# Patient Record
Sex: Female | Born: 1975 | Race: Black or African American | Hispanic: No | Smoking: Never smoker
Health system: Southern US, Community
[De-identification: ages and names within clinical notes are randomized; demographics above are authoritative.]

## PROBLEM LIST (undated history)

## (undated) DIAGNOSIS — IMO0002 Reserved for concepts with insufficient information to code with codable children: Secondary | ICD-10-CM

## (undated) DIAGNOSIS — T7840XA Allergy, unspecified, initial encounter: Secondary | ICD-10-CM

## (undated) DIAGNOSIS — N879 Dysplasia of cervix uteri, unspecified: Secondary | ICD-10-CM

## (undated) DIAGNOSIS — R87610 Atypical squamous cells of undetermined significance on cytologic smear of cervix (ASC-US): Secondary | ICD-10-CM

## (undated) DIAGNOSIS — E282 Polycystic ovarian syndrome: Secondary | ICD-10-CM

## (undated) DIAGNOSIS — N970 Female infertility associated with anovulation: Secondary | ICD-10-CM

## (undated) DIAGNOSIS — G43109 Migraine with aura, not intractable, without status migrainosus: Secondary | ICD-10-CM

## (undated) DIAGNOSIS — R87619 Unspecified abnormal cytological findings in specimens from cervix uteri: Secondary | ICD-10-CM

## (undated) HISTORY — PX: BREAST BIOPSY: SHX20

## (undated) HISTORY — PX: BUNIONECTOMY: SHX129

## (undated) HISTORY — DX: Reserved for concepts with insufficient information to code with codable children: IMO0002

## (undated) HISTORY — DX: Polycystic ovarian syndrome: E28.2

## (undated) HISTORY — DX: Dysplasia of cervix uteri, unspecified: N87.9

## (undated) HISTORY — PX: CERVICAL BIOPSY  W/ LOOP ELECTRODE EXCISION: SUR135

## (undated) HISTORY — DX: Allergy, unspecified, initial encounter: T78.40XA

## (undated) HISTORY — DX: Unspecified abnormal cytological findings in specimens from cervix uteri: R87.619

## (undated) HISTORY — DX: Migraine with aura, not intractable, without status migrainosus: G43.109

## (undated) HISTORY — DX: Atypical squamous cells of undetermined significance on cytologic smear of cervix (ASC-US): R87.610

## (undated) HISTORY — PX: EYE SURGERY: SHX253

## (undated) HISTORY — DX: Female infertility associated with anovulation: N97.0

## (undated) HISTORY — PX: WISDOM TOOTH EXTRACTION: SHX21

## (undated) HISTORY — PX: KNEE ARTHROSCOPY: SUR90

## (undated) HISTORY — PX: LASIK: SHX215

---
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
## 1998-04-08 ENCOUNTER — Other Ambulatory Visit: Admission: RE | Admit: 1998-04-08 | Discharge: 1998-04-08 | Payer: Self-pay | Admitting: Obstetrics and Gynecology

## 1998-12-13 ENCOUNTER — Other Ambulatory Visit: Admission: RE | Admit: 1998-12-13 | Discharge: 1998-12-13 | Payer: Self-pay | Admitting: Obstetrics and Gynecology

## 2000-03-28 ENCOUNTER — Other Ambulatory Visit: Admission: RE | Admit: 2000-03-28 | Discharge: 2000-03-28 | Payer: Self-pay | Admitting: Obstetrics and Gynecology

## 2000-05-07 ENCOUNTER — Encounter: Admission: RE | Admit: 2000-05-07 | Discharge: 2000-05-07 | Payer: Self-pay | Admitting: Obstetrics and Gynecology

## 2000-05-07 ENCOUNTER — Encounter: Payer: Self-pay | Admitting: Obstetrics and Gynecology

## 2001-01-20 ENCOUNTER — Emergency Department (HOSPITAL_COMMUNITY): Admission: EM | Admit: 2001-01-20 | Discharge: 2001-01-20 | Payer: Self-pay | Admitting: Emergency Medicine

## 2001-04-24 ENCOUNTER — Other Ambulatory Visit: Admission: RE | Admit: 2001-04-24 | Discharge: 2001-04-24 | Payer: Self-pay | Admitting: Obstetrics and Gynecology

## 2001-11-27 ENCOUNTER — Emergency Department (HOSPITAL_COMMUNITY): Admission: EM | Admit: 2001-11-27 | Discharge: 2001-11-27 | Payer: Self-pay | Admitting: Emergency Medicine

## 2002-02-03 ENCOUNTER — Other Ambulatory Visit: Admission: RE | Admit: 2002-02-03 | Discharge: 2002-02-03 | Payer: Self-pay | Admitting: Obstetrics and Gynecology

## 2002-06-10 ENCOUNTER — Encounter: Payer: Self-pay | Admitting: Emergency Medicine

## 2002-06-10 ENCOUNTER — Encounter: Admission: RE | Admit: 2002-06-10 | Discharge: 2002-06-10 | Payer: Self-pay | Admitting: Emergency Medicine

## 2002-11-28 ENCOUNTER — Encounter: Admission: RE | Admit: 2002-11-28 | Discharge: 2002-11-28 | Payer: Self-pay | Admitting: Emergency Medicine

## 2002-11-28 ENCOUNTER — Encounter: Payer: Self-pay | Admitting: Emergency Medicine

## 2003-01-14 ENCOUNTER — Encounter: Payer: Self-pay | Admitting: Emergency Medicine

## 2003-01-14 ENCOUNTER — Encounter: Admission: RE | Admit: 2003-01-14 | Discharge: 2003-01-14 | Payer: Self-pay | Admitting: Emergency Medicine

## 2003-02-07 ENCOUNTER — Encounter: Payer: Self-pay | Admitting: Emergency Medicine

## 2003-02-07 ENCOUNTER — Encounter: Admission: RE | Admit: 2003-02-07 | Discharge: 2003-02-07 | Payer: Self-pay | Admitting: Emergency Medicine

## 2003-02-11 ENCOUNTER — Other Ambulatory Visit: Admission: RE | Admit: 2003-02-11 | Discharge: 2003-02-11 | Payer: Self-pay | Admitting: Obstetrics and Gynecology

## 2004-02-29 ENCOUNTER — Other Ambulatory Visit: Admission: RE | Admit: 2004-02-29 | Discharge: 2004-02-29 | Payer: Self-pay | Admitting: Obstetrics and Gynecology

## 2004-03-07 ENCOUNTER — Ambulatory Visit (HOSPITAL_COMMUNITY): Admission: RE | Admit: 2004-03-07 | Discharge: 2004-03-07 | Payer: Self-pay | Admitting: Obstetrics and Gynecology

## 2004-03-17 ENCOUNTER — Ambulatory Visit (HOSPITAL_COMMUNITY): Admission: RE | Admit: 2004-03-17 | Discharge: 2004-03-17 | Payer: Self-pay | Admitting: Obstetrics and Gynecology

## 2004-12-04 HISTORY — PX: LEEP: SHX91

## 2005-03-29 ENCOUNTER — Other Ambulatory Visit: Admission: RE | Admit: 2005-03-29 | Discharge: 2005-03-29 | Payer: Self-pay | Admitting: Obstetrics and Gynecology

## 2005-06-09 ENCOUNTER — Ambulatory Visit (HOSPITAL_COMMUNITY): Admission: RE | Admit: 2005-06-09 | Discharge: 2005-06-09 | Payer: Self-pay | Admitting: Obstetrics and Gynecology

## 2005-06-09 ENCOUNTER — Encounter (INDEPENDENT_AMBULATORY_CARE_PROVIDER_SITE_OTHER): Payer: Self-pay | Admitting: Specialist

## 2005-09-05 ENCOUNTER — Other Ambulatory Visit: Admission: RE | Admit: 2005-09-05 | Discharge: 2005-09-05 | Payer: Self-pay | Admitting: Obstetrics and Gynecology

## 2006-04-02 ENCOUNTER — Other Ambulatory Visit: Admission: RE | Admit: 2006-04-02 | Discharge: 2006-04-02 | Payer: Self-pay | Admitting: Obstetrics and Gynecology

## 2008-06-08 ENCOUNTER — Encounter: Admission: RE | Admit: 2008-06-08 | Discharge: 2008-06-08 | Payer: Self-pay | Admitting: Emergency Medicine

## 2011-04-21 NOTE — Op Note (Signed)
NAMEVERMA, GROTHAUS                ACCOUNT NO.:  [id number]   MEDICAL RECORD NO.:  [id number]          PATIENT TYPE:  AMB   LOCATION:  SDC                           FACILITY:  [facility]   PHYSICIAN:  Janine Limbo, M.D.DATE OF BIRTH:  08-03-1976   DATE OF PROCEDURE:  06/09/2005  DATE OF DISCHARGE:                                 OPERATIVE REPORT   PREOPERATIVE DIAGNOSES:  1.  Recurrent atypical squamous cells of undetermined significance on Pap      smear.  2.  Inadequate colposcopy.  3.  Prior cryotherapy of the cervix.   POSTOPERATIVE DIAGNOSES:  1.  Recurrent atypical squamous cells of undetermined significance on Pap      smear.  2.  Inadequate colposcopy.  3.  Prior cryotherapy of the cervix.   PROCEDURE:  Loop electrosurgical excision procedure.   SURGEON:  Dr. Leonard Schwartz.   ANESTHETIC:  Local.   DISPOSITION:  Ms. Gaugh is a 35 year old female, who presents with a  history of recurrent abnormal Pap smears.  Colposcopically directed biopsies  were performed, but we could not fully evaluate the transition zone.  The  patient has had a prior cryotherapy procedure of the cervix.  The patient  understands the indications for her procedure and she accepts the risk of,  but not limited to, anesthetic complications, bleeding, infections, and  possible damage to the surrounding organs.   FINDINGS:  No lesions were noted on the exocervix.   PROCEDURE:  The patient was taken to the operating room where she was placed  in the lithotomy position.  The vagina and cervix were prepped with multiple  layers of Betadine.  The cervix was injected with a diluted solution of  Marcaine, Pitressin, and normal saline.  We used the LEEP machine to obtain  a conization specimen that removed the endocervix.  Hemostasis was noted to  be adequate.  The estimated blood loss for the procedure was 1 mL.  The  patient tolerated her procedure well.  She was returned to the supine  position and then taken to the recovery room in stable condition.  The  conization specimen was sent to pathology for evaluation.   FOLLOW-UP INSTRUCTIONS:  The patient was given a prescription for Vicodin,  and she will take 1-2 tablets every 4 hours as needed for pain.  She will  return to see Dr. Stefano Gaul in 2-3 weeks for follow-up examination.  She  was given a copy of the postoperative instruction sheet as prepared by [facility] for patients who have undergone a dilatation  and curettage, but then that instruction sheet was modified for a LEEP  procedure.       AVS/MEDQ  D:  06/09/2005  T:  06/09/2005  Job:  [id number]

## 2011-04-21 NOTE — H&P (Signed)
NAMEAMARII, Miranda Knight                ACCOUNT NO.:  [id number]   MEDICAL RECORD NO.:  [id number]          PATIENT TYPE:  AMB   LOCATION:  SDC                           FACILITY:  [facility]   PHYSICIAN:  Janine Limbo, M.D.DATE OF BIRTH:  1976-03-04   DATE OF ADMISSION:  DATE OF DISCHARGE:                                HISTORY & PHYSICAL   HISTORY OF PRESENT ILLNESS:  Ms. Barillas is a 35 year old female, para 0-0-1-  0, who presents for a loop electrosurgical excision and procedure because of  recurrent abnormal Pap smears with a known history of the human papilloma  virus.  Colposcopy and biopsies were performed on Apr 26, 2005, but the  transition zone could not be fully seen.  The patient had cryotherapy in  1998.   PAST MEDICAL HISTORY:  The patient had her wisdom teeth removed in 1995.   ALLERGIES:  No known drug allergies.   OBSTETRICAL HISTORY:  The patient has had one elective pregnancy termination  in the first trimester.   SOCIAL HISTORY:  The patient denies cigarette use, alcohol use, and  recreational drug use.   REVIEW OF SYSTEMS:  Noncontributory.   FAMILY HISTORY:  Noncontributory.   PHYSICAL EXAMINATION:  VITAL SIGNS:  Weight is 148 pounds, height is 5 feet  3 inches.  HEENT:  Within normal limits.  CHEST:  Clear.  HEART:  Regular rate and rhythm.  BREASTS:  Her breasts are without masses.  ABDOMEN:  Nontender.  EXTREMITIES:  Within normal limits.  NEUROLOGIC:  Exam is grossly normal.  PELVIC:  External genitalia are normal.  The vagina is normal.  The cervix  is nontender.  No lesions were seen on the exocervix with the colposcope.  The uterus is normal size, shape, and consistency.  Adnexa:  No masses.  Rectovaginal exam confirms.   ASSESSMENT:  Recurrent abnormal Pap smears with a transition zone that  cannot be completely seen.  The patient had cryotherapy in the past.   PLAN:  The patient will undergo a loop electrosurgical excision procedure.  She  understands the indications for her procedure and she accepts the risks  of, but not limited to, anesthetic complications, bleeding, infections, and  possible damage to the surrounding organs.       AVS/MEDQ  D:  06/08/2005  T:  06/08/2005  Job:  [id number]

## 2012-01-21 ENCOUNTER — Ambulatory Visit (INDEPENDENT_AMBULATORY_CARE_PROVIDER_SITE_OTHER): Payer: BC Managed Care – PPO | Admitting: Family Medicine

## 2012-01-21 VITALS — BP 124/79 | HR 85 | Temp 98.0°F | Resp 16 | Ht 64.5 in | Wt 173.0 lb

## 2012-01-21 DIAGNOSIS — H6990 Unspecified Eustachian tube disorder, unspecified ear: Secondary | ICD-10-CM

## 2012-01-21 DIAGNOSIS — J069 Acute upper respiratory infection, unspecified: Secondary | ICD-10-CM

## 2012-01-21 DIAGNOSIS — H6980 Other specified disorders of Eustachian tube, unspecified ear: Secondary | ICD-10-CM

## 2012-01-21 MED ORDER — FLUTICASONE PROPIONATE 50 MCG/ACT NA SUSP: 2.0000 | Freq: Every day | NASAL | Status: DC

## 2012-01-21 MED ORDER — PREDNISONE 20 MG PO TABS: ORAL_TABLET | ORAL | Status: AC

## 2012-01-21 NOTE — Progress Notes (Signed)
  Subjective:    Patient ID: Miranda Knight, female    DOB: [birth year], 36 y.o.   MRN: [id number]  HPI 36 yo female with URI complaints.  Sinus fullness drainage since yesterday.  PND.  No cough.  Ear fullness on right.  Scrathcy throat.  NO fever.  Occ HA.  Last sinusitis March 2012.  Trying to get pregnant, could be now (LMP 12/31/11), and so hasn't tried any otcs.   Review of Systems Negative except as per HPI     Objective:   Physical Exam  Constitutional: She appears well-developed. No distress.  HENT:  Right Ear: External ear and ear canal normal. Tympanic membrane is not injected, not scarred, not perforated, not erythematous, not retracted and not bulging. A middle ear effusion is present.  Left Ear: Tympanic membrane, external ear and ear canal normal. Tympanic membrane is not injected, not scarred, not perforated, not erythematous, not retracted and not bulging.  Nose: Mucosal edema present. No rhinorrhea. Right sinus exhibits no maxillary sinus tenderness and no frontal sinus tenderness. Left sinus exhibits no maxillary sinus tenderness and no frontal sinus tenderness.  Mouth/Throat: Uvula is midline, oropharynx is clear and moist and mucous membranes are normal. No oropharyngeal exudate or tonsillar abscesses.  Cardiovascular: Normal rate, regular rhythm, normal heart sounds and intact distal pulses.   No murmur heard. Pulmonary/Chest: Effort normal and breath sounds normal. No respiratory distress. She has no wheezes. She has no rales.  Lymphadenopathy:       Head (right side): No submandibular and no preauricular adenopathy present.       Head (left side): No submandibular and no preauricular adenopathy present.       Right cervical: No superficial cervical and no posterior cervical adenopathy present.      Left cervical: No superficial cervical and no posterior cervical adenopathy present.       Right: No supraclavicular adenopathy present.       Left: No supraclavicular  adenopathy present.  Skin: Skin is warm and dry.          Assessment & Plan:  ETD/URI  Flonase, predtaper  If does not resolve or worsens, can try amoxicillin.

## 2012-01-24 ENCOUNTER — Telehealth: Payer: Self-pay

## 2012-01-24 NOTE — Telephone Encounter (Signed)
Patient saw dr Georgiana Shore on Sunday she said her ears are still hurting and she feels like her sinus issues are getting worse would like for a nurse to call her back or dr Georgiana Shore when possible

## 2012-01-24 NOTE — Telephone Encounter (Signed)
Dr Georgiana Shore told her to cb by Wednesday if Miranda Knight wasn't getting any better. Miranda Knight says her sinuses are still draining. Right ear pressure. What can we do?

## 2012-01-25 MED ORDER — IPRATROPIUM BROMIDE 0.03 % NA SOLN: 2.0000 | Freq: Two times a day (BID) | NASAL | Status: DC

## 2012-01-25 MED ORDER — AMOXICILLIN 875 MG PO TABS: 1750.0000 mg | ORAL_TABLET | Freq: Two times a day (BID) | ORAL | Status: AC

## 2012-01-25 NOTE — Telephone Encounter (Signed)
I've authorized abx and atrovent nasal spray to help improve drainage and reduce ear pressure.  Please advise patient.

## 2012-01-25 NOTE — Telephone Encounter (Signed)
Patient notified

## 2012-09-15 ENCOUNTER — Ambulatory Visit: Payer: BC Managed Care – PPO

## 2012-09-15 ENCOUNTER — Ambulatory Visit (INDEPENDENT_AMBULATORY_CARE_PROVIDER_SITE_OTHER): Payer: BC Managed Care – PPO | Admitting: Family Medicine

## 2012-09-15 VITALS — BP 110/70 | HR 73 | Temp 98.4°F | Resp 16 | Ht 64.5 in | Wt 173.4 lb

## 2012-09-15 DIAGNOSIS — M7630 Iliotibial band syndrome, unspecified leg: Secondary | ICD-10-CM

## 2012-09-15 DIAGNOSIS — M722 Plantar fascial fibromatosis: Secondary | ICD-10-CM

## 2012-09-15 DIAGNOSIS — M25559 Pain in unspecified hip: Secondary | ICD-10-CM

## 2012-09-15 DIAGNOSIS — M629 Disorder of muscle, unspecified: Secondary | ICD-10-CM

## 2012-09-15 DIAGNOSIS — M25552 Pain in left hip: Secondary | ICD-10-CM

## 2012-09-15 DIAGNOSIS — M79671 Pain in right foot: Secondary | ICD-10-CM

## 2012-09-15 DIAGNOSIS — E282 Polycystic ovarian syndrome: Secondary | ICD-10-CM

## 2012-09-15 DIAGNOSIS — M79609 Pain in unspecified limb: Secondary | ICD-10-CM

## 2012-09-15 LAB — POCT URINE PREGNANCY: Preg Test, Ur: NEGATIVE

## 2012-09-15 NOTE — Patient Instructions (Addendum)
1. Pain of right heel  DG Foot 2 Views Right  2. Pain, joint, hip, left  DG Hip Complete Left  3. PCOS (polycystic ovarian syndrome)  POCT urine pregnancy  4. Plantar fasciitis of right foot    5. Iliotibial band syndrome     Iliotibial Band Syndrome with Rehab The iliotibial (IT) band is a tendon that connects the hip muscles to the shinbone (tibia) and to one of the bones of the pelvis (ileum). The IT band passes by the knee and is often irritated by the outer portion of the knee (lateral femoral condyle). A fluid filled sac (bursa) exists between the tendon and the bone, to cushion and reduce friction. Overuse of the tendon may cause excessive friction, which results in IT band syndrome. This condition involves inflammation of the bursa (bursitis) and/or inflammation of the IT band (tendinitis). SYMPTOMS   Pain, tenderness, swelling, warmth, or redness over the IT band, at the outer knee (above the joint).  Pain that travels up or down the thigh or leg.  Initially, pain at the beginning of an exercise, that decreases once warmed up. Eventually, pain throughout the activity, getting worse as the activity continues. May cause the athlete to stop in the middle of training or competing.  Pain that gets worse when running down hills or stairs, on banked tracks, or next to the curb on the street.  Pain that increases when the foot of the affected leg hits the ground.  Possibly, a crackling sound (crepitation) when the tendon or bursa is moved or touched. CAUSES  IT band syndrome is caused by irritation of the IT band and the underlying bursa. This eventually results in inflammation and pain. IT band syndrome is an overuse injury.  RISK INCREASES WITH:  Sports with repetitive knee-bending activities (distance running, cycling).  Incorrect training techniques, including sudden changes in the intensity, frequency, or duration of training.  Not enough rest between workouts.  Poor strength  and flexibility, especially a tight IT band.  Failure to warm up properly before activity.  Bow legs.  Arthritis of the knee. PREVENTION   Warm up and stretch properly before activity.  Allow for adequate recovery between workouts.  Maintain physical fitness:  Strength, flexibility, and endurance.  Cardiovascular fitness.  Learn and use proper training technique, including reducing running mileage, shortening stride, and avoiding running on hills and banked surfaces.  Wear arch supports (orthotics), if you have flat feet. PROGNOSIS  If treated properly, IT band syndrome usually goes away within 6 weeks of treatment. RELATED COMPLICATIONS   Longer healing time, if not properly treated, or if not given enough time to heal.  Recurring inflammation of the tendon and bursa, that may result in a chronic condition.  Recurring symptoms, if activity is resumed too soon, with overuse, with a direct blow, or with poor training technique.  Inability to complete training or competition. TREATMENT  Treatment first involves the use of ice and medicine, to reduce pain and inflammation. The use of strengthening and stretching exercises may help reduce pain with activity. These exercises may be performed at home or with a therapist. For individuals with flat feet, an arch support (orthotic) may be helpful. Some individuals find that wearing a knee sleeve or compression bandage around the knee during workouts provides some relief. Certain training techniques, such as adjusting stride length, avoiding running on hills or stairs, changing the direction you run on a circular or banked track, or changing the side of the road  you run on, if you run next to the curb, may help decrease symptoms of IT band syndrome. Cyclists may need to change the seat height or foot position on their bicycles. An injection of cortisone into the bursa may be recommended. Surgery to remove the inflamed bursa and/or part of the  IT band is only considered after at least 6 months of non-surgical treatment.  MEDICATION   If pain medicine is needed, nonsteroidal anti-inflammatory medicines (aspirin and ibuprofen), or other minor pain relievers (acetaminophen), are often advised.  Do not take pain medicine for 7 days before surgery.  Prescription pain relievers may be given, if your caregiver thinks they are needed. Use only as directed and only as much as you need.  Corticosteroid injections may be given by your caregiver. These injections should be reserved for the most serious cases, because they may only be given a certain number of times. HEAT AND COLD  Cold treatment (icing) should be applied for 10 to 15 minutes every 2 to 3 hours for inflammation and pain, and immediately after activity that aggravates your symptoms. Use ice packs or an ice massage.  Heat treatment may be used before performing stretching and strengthening activities prescribed by your caregiver, physical therapist, or athletic trainer. Use a heat pack or a warm water soak. SEEK MEDICAL CARE IF:   Symptoms get worse or do not improve in 2 to 4 weeks, despite treatment.  New, unexplained symptoms develop. (Drugs used in treatment may produce side effects.) EXERCISES  RANGE OF MOTION (ROM) AND STRETCHING EXERCISES - Iliotibial Band Syndrome These exercises may help you when beginning to rehabilitate your injury. Your symptoms may go away with or without further involvement from your physician, physical therapist or athletic trainer. While completing these exercises, remember:   Restoring tissue flexibility helps normal motion to return to the joints. This allows healthier, less painful movement and activity.  An effective stretch should be held for at least 30 seconds.  A stretch should never be painful. You should only feel a gentle lengthening or release in the stretched tissue. STRETCH - Quadriceps, Prone   Lie on your stomach on a firm  surface, such as a bed or padded floor.  Bend your right / left knee and grasp your ankle. If you are unable to reach your ankle or pant leg, use a belt around your foot to lengthen your reach.  Gently pull your heel toward your buttocks. Your knee should not slide out to the side. You should feel a stretch in the front of your thigh and knee.  Hold this position for __________ seconds. Repeat __________ times. Complete this stretch __________ times per day.  STRETCH  Iliotibial Band  On the floor or bed, lie on your side, so your right / left leg is on top. Bend your knee and grab your ankle.  Slowly bring your knee back so that your thigh is in line with your trunk. Keep your heel at your buttocks and gently arch your back, so your head, shoulders and hips line up.  Slowly lower your leg so that your knee approaches the floor or bed, until you feel a gentle stretch on the outside of your right / left thigh. If you do not feel a stretch and your knee will not fall farther, place the heel of your opposite foot on top of your knee, and pull your thigh down farther.  Hold this stretch for __________ seconds. Repeat __________ times. Complete this stretch __________  times per day. STRENGTHENING EXERCISES - Iliotibial Band Syndrome Improving the flexibility of the IT band will best relieve your discomfort due to IT band syndrome. Strengthening exercises, however, can help improve both muscle endurance and joint mechanics, reducing the factors that can contribute to this condition. Your physician, physical therapist or athletic trainer may provide you with exercises that train specific muscle groups that are especially weak. The following exercises target muscles that are often weak in people who have IT band syndrome. STRENGTH - Hip Abductors, Straight Leg Raises  Be aware of your form throughout the entire exercise, so that you exercise the correct muscles. Poor form means that you are not  strengthening the correct muscles.  Lie on your side, so that your head, shoulders, knee and hip line up. You may bend your lower knee to help maintain your balance. Your right / left leg should be on top.  Roll your hips slightly forward, so that your hips are stacked directly over each other and your right / left knee is facing forward.  Lift your top leg up 4-6 inches, leading with your heel. Be sure that your foot does not drift forward and that your knee does not roll toward the ceiling.  Hold this position for __________ seconds. You should feel the muscles in your outer hip lifting (you may not notice this until your leg begins to tire).  Slowly lower your leg to the starting position. Allow the muscles to fully relax before beginning the next repetition. Repeat __________ times. Complete this exercise __________ times per day.  STRENGTH - Quad/VMO, Isometric  Sit in a chair with your right / left knee slightly bent. With your fingertips, feel the VMO muscle (just above the inside of your knee). The VMO is important in controlling the position of your kneecap.  Keeping your fingertips on this muscle. Without actually moving your leg, attempt to drive your knee down, as if straightening your leg. You should feel your VMO tense. If you have a difficult time, you may wish to try the same exercise on your healthy knee first.  Tense this muscle as hard as you can, without increasing any knee pain.  Hold for __________ seconds. Relax the muscles slowly and completely between each repetition. Repeat __________ times. Complete this exercise __________ times per day.  Document Released: 11/20/2005 Document Revised: 02/12/2012 Document Reviewed: 03/04/2009 [facility] Patient Information 2013 [place], [place]. Plantar Fasciitis (Heel Spur Syndrome) with Rehab The plantar fascia is a fibrous, ligament-like, soft-tissue structure that spans the bottom of the foot. Plantar fasciitis is a condition  that causes pain in the foot due to inflammation of the tissue. SYMPTOMS   Pain and tenderness on the underneath side of the foot.  Pain that worsens with standing or walking. CAUSES  Plantar fasciitis is caused by irritation and injury to the plantar fascia on the underneath side of the foot. Common mechanisms of injury include:  Direct trauma to bottom of the foot.  Damage to a small nerve that runs under the foot where the main fascia attaches to the heel bone.  Stress placed on the plantar fascia due to bone spurs. RISK INCREASES WITH:   Activities that place stress on the plantar fascia (running, jumping, pivoting, or cutting).  Poor strength and flexibility.  Improperly fitted shoes.  Tight calf muscles.  Flat feet.  Failure to warm-up properly before activity.  Obesity. PREVENTION  Warm up and stretch properly before activity.  Allow for adequate recovery between workouts.  Maintain physical fitness:  Strength, flexibility, and endurance.  Cardiovascular fitness.  Maintain a health body weight.  Avoid stress on the plantar fascia.  Wear properly fitted shoes, including arch supports for individuals who have flat feet. PROGNOSIS  If treated properly, then the symptoms of plantar fasciitis usually resolve without surgery. However, occasionally surgery is necessary. RELATED COMPLICATIONS   Recurrent symptoms that may result in a chronic condition.  Problems of the lower back that are caused by compensating for the injury, such as limping.  Pain or weakness of the foot during push-off following surgery.  Chronic inflammation, scarring, and partial or complete fascia tear, occurring more often from repeated injections. TREATMENT  Treatment initially involves the use of ice and medication to help reduce pain and inflammation. The use of strengthening and stretching exercises may help reduce pain with activity, especially stretches of the Achilles tendon.  These exercises may be performed at home or with a therapist. Your caregiver may recommend that you use heel cups of arch supports to help reduce stress on the plantar fascia. Occasionally, corticosteroid injections are given to reduce inflammation. If symptoms persist for greater than 6 months despite non-surgical (conservative), then surgery may be recommended.  MEDICATION   If pain medication is necessary, then nonsteroidal anti-inflammatory medications, such as aspirin and ibuprofen, or other minor pain relievers, such as acetaminophen, are often recommended.  Do not take pain medication within 7 days before surgery.  Prescription pain relievers may be given if deemed necessary by your caregiver. Use only as directed and only as much as you need.  Corticosteroid injections may be given by your caregiver. These injections should be reserved for the most serious cases, because they may only be given a certain number of times. HEAT AND COLD  Cold treatment (icing) relieves pain and reduces inflammation. Cold treatment should be applied for 10 to 15 minutes every 2 to 3 hours for inflammation and pain and immediately after any activity that aggravates your symptoms. Use ice packs or massage the area with a piece of ice (ice massage).  Heat treatment may be used prior to performing the stretching and strengthening activities prescribed by your caregiver, physical therapist, or athletic trainer. Use a heat pack or soak the injury in warm water. SEEK IMMEDIATE MEDICAL CARE IF:  Treatment seems to offer no benefit, or the condition worsens.  Any medications produce adverse side effects. EXERCISES RANGE OF MOTION (ROM) AND STRETCHING EXERCISES - Plantar Fasciitis (Heel Spur Syndrome) These exercises may help you when beginning to rehabilitate your injury. Your symptoms may resolve with or without further involvement from your physician, physical therapist or athletic trainer. While completing these  exercises, remember:   Restoring tissue flexibility helps normal motion to return to the joints. This allows healthier, less painful movement and activity.  An effective stretch should be held for at least 30 seconds.  A stretch should never be painful. You should only feel a gentle lengthening or release in the stretched tissue. RANGE OF MOTION - Toe Extension, Flexion  Sit with your right / left leg crossed over your opposite knee.  Grasp your toes and gently pull them back toward the top of your foot. You should feel a stretch on the bottom of your toes and/or foot.  Hold this stretch for __________ seconds.  Now, gently pull your toes toward the bottom of your foot. You should feel a stretch on the top of your toes and or foot.  Hold this stretch for __________  seconds. Repeat __________ times. Complete this stretch __________ times per day.  RANGE OF MOTION - Ankle Dorsiflexion, Active Assisted  Remove shoes and sit on a chair that is preferably not on a carpeted surface.  Place right / left foot under knee. Extend your opposite leg for support.  Keeping your heel down, slide your right / left foot back toward the chair until you feel a stretch at your ankle or calf. If you do not feel a stretch, slide your bottom forward to the edge of the chair, while still keeping your heel down.  Hold this stretch for __________ seconds. Repeat __________ times. Complete this stretch __________ times per day.  STRETCH  Gastroc, Standing  Place hands on wall.  Extend right / left leg, keeping the front knee somewhat bent.  Slightly point your toes inward on your back foot.  Keeping your right / left heel on the floor and your knee straight, shift your weight toward the wall, not allowing your back to arch.  You should feel a gentle stretch in the right / left calf. Hold this position for __________ seconds. Repeat __________ times. Complete this stretch __________ times per  day. STRETCH  Soleus, Standing  Place hands on wall.  Extend right / left leg, keeping the other knee somewhat bent.  Slightly point your toes inward on your back foot.  Keep your right / left heel on the floor, bend your back knee, and slightly shift your weight over the back leg so that you feel a gentle stretch deep in your back calf.  Hold this position for __________ seconds. Repeat __________ times. Complete this stretch __________ times per day. STRETCH  Gastrocsoleus, Standing  Note: This exercise can place a lot of stress on your foot and ankle. Please complete this exercise only if specifically instructed by your caregiver.   Place the ball of your right / left foot on a step, keeping your other foot firmly on the same step.  Hold on to the wall or a rail for balance.  Slowly lift your other foot, allowing your body weight to press your heel down over the edge of the step.  You should feel a stretch in your right / left calf.  Hold this position for __________ seconds.  Repeat this exercise with a slight bend in your right / left knee. Repeat __________ times. Complete this stretch __________ times per day.  STRENGTHENING EXERCISES - Plantar Fasciitis (Heel Spur Syndrome)  These exercises may help you when beginning to rehabilitate your injury. They may resolve your symptoms with or without further involvement from your physician, physical therapist or athletic trainer. While completing these exercises, remember:   Muscles can gain both the endurance and the strength needed for everyday activities through controlled exercises.  Complete these exercises as instructed by your physician, physical therapist or athletic trainer. Progress the resistance and repetitions only as guided. STRENGTH - Towel Curls  Sit in a chair positioned on a non-carpeted surface.  Place your foot on a towel, keeping your heel on the floor.  Pull the towel toward your heel by only curling your  toes. Keep your heel on the floor.  If instructed by your physician, physical therapist or athletic trainer, add ____________________ at the end of the towel. Repeat __________ times. Complete this exercise __________ times per day. STRENGTH - Ankle Inversion  Secure one end of a rubber exercise band/tubing to a fixed object (table, pole). Loop the other end around your foot just  before your toes.  Place your fists between your knees. This will focus your strengthening at your ankle.  Slowly, pull your big toe up and in, making sure the band/tubing is positioned to resist the entire motion.  Hold this position for __________ seconds.  Have your muscles resist the band/tubing as it slowly pulls your foot back to the starting position. Repeat __________ times. Complete this exercises __________ times per day.  Document Released: 11/20/2005 Document Revised: 02/12/2012 Document Reviewed: 03/04/2009 [facility] Patient Information 2013 [place], [place]. Plantar Fasciitis (Heel Spur Syndrome) with Rehab The plantar fascia is a fibrous, ligament-like, soft-tissue structure that spans the bottom of the foot. Plantar fasciitis is a condition that causes pain in the foot due to inflammation of the tissue. SYMPTOMS  Pain and tenderness on the underneath side of the foot. Pain that worsens with standing or walking. CAUSES  Plantar fasciitis is caused by irritation and injury to the plantar fascia on the underneath side of the foot. Common mechanisms of injury include: Direct trauma to bottom of the foot. Damage to a small nerve that runs under the foot where the main fascia attaches to the heel bone. Stress placed on the plantar fascia due to bone spurs. RISK INCREASES WITH:  Activities that place stress on the plantar fascia (running, jumping, pivoting, or cutting). Poor strength and flexibility. Improperly fitted shoes. Tight calf muscles. Flat feet. Failure to warm-up properly before  activity. Obesity. PREVENTION Warm up and stretch properly before activity. Allow for adequate recovery between workouts. Maintain physical fitness: Strength, flexibility, and endurance. Cardiovascular fitness. Maintain a health body weight. Avoid stress on the plantar fascia. Wear properly fitted shoes, including arch supports for individuals who have flat feet. PROGNOSIS  If treated properly, then the symptoms of plantar fasciitis usually resolve without surgery. However, occasionally surgery is necessary. RELATED COMPLICATIONS  Recurrent symptoms that may result in a chronic condition. Problems of the lower back that are caused by compensating for the injury, such as limping. Pain or weakness of the foot during push-off following surgery. Chronic inflammation, scarring, and partial or complete fascia tear, occurring more often from repeated injections. TREATMENT  Treatment initially involves the use of ice and medication to help reduce pain and inflammation. The use of strengthening and stretching exercises may help reduce pain with activity, especially stretches of the Achilles tendon. These exercises may be performed at home or with a therapist. Your caregiver may recommend that you use heel cups of arch supports to help reduce stress on the plantar fascia. Occasionally, corticosteroid injections are given to reduce inflammation. If symptoms persist for greater than 6 months despite non-surgical (conservative), then surgery may be recommended.  MEDICATION  If pain medication is necessary, then nonsteroidal anti-inflammatory medications, such as aspirin and ibuprofen, or other minor pain relievers, such as acetaminophen, are often recommended. Do not take pain medication within 7 days before surgery. Prescription pain relievers may be given if deemed necessary by your caregiver. Use only as directed and only as much as you need. Corticosteroid injections may be given by your caregiver.  These injections should be reserved for the most serious cases, because they may only be given a certain number of times. HEAT AND COLD Cold treatment (icing) relieves pain and reduces inflammation. Cold treatment should be applied for 10 to 15 minutes every 2 to 3 hours for inflammation and pain and immediately after any activity that aggravates your symptoms. Use ice packs or massage the area with a piece of ice (  ice massage). Heat treatment may be used prior to performing the stretching and strengthening activities prescribed by your caregiver, physical therapist, or athletic trainer. Use a heat pack or soak the injury in warm water. SEEK IMMEDIATE MEDICAL CARE IF: Treatment seems to offer no benefit, or the condition worsens. Any medications produce adverse side effects. EXERCISES RANGE OF MOTION (ROM) AND STRETCHING EXERCISES - Plantar Fasciitis (Heel Spur Syndrome) These exercises may help you when beginning to rehabilitate your injury. Your symptoms may resolve with or without further involvement from your physician, physical therapist or athletic trainer. While completing these exercises, remember:  Restoring tissue flexibility helps normal motion to return to the joints. This allows healthier, less painful movement and activity. An effective stretch should be held for at least 30 seconds. A stretch should never be painful. You should only feel a gentle lengthening or release in the stretched tissue. RANGE OF MOTION - Toe Extension, Flexion Sit with your right / left leg crossed over your opposite knee. Grasp your toes and gently pull them back toward the top of your foot. You should feel a stretch on the bottom of your toes and/or foot. Hold this stretch for __________ seconds. Now, gently pull your toes toward the bottom of your foot. You should feel a stretch on the top of your toes and or foot. Hold this stretch for __________ seconds. Repeat __________ times. Complete this stretch  __________ times per day.  RANGE OF MOTION - Ankle Dorsiflexion, Active Assisted Remove shoes and sit on a chair that is preferably not on a carpeted surface. Place right / left foot under knee. Extend your opposite leg for support. Keeping your heel down, slide your right / left foot back toward the chair until you feel a stretch at your ankle or calf. If you do not feel a stretch, slide your bottom forward to the edge of the chair, while still keeping your heel down. Hold this stretch for __________ seconds. Repeat __________ times. Complete this stretch __________ times per day.  STRETCH  Gastroc, Standing Place hands on wall. Extend right / left leg, keeping the front knee somewhat bent. Slightly point your toes inward on your back foot. Keeping your right / left heel on the floor and your knee straight, shift your weight toward the wall, not allowing your back to arch. You should feel a gentle stretch in the right / left calf. Hold this position for __________ seconds. Repeat __________ times. Complete this stretch __________ times per day. STRETCH  Soleus, Standing Place hands on wall. Extend right / left leg, keeping the other knee somewhat bent. Slightly point your toes inward on your back foot. Keep your right / left heel on the floor, bend your back knee, and slightly shift your weight over the back leg so that you feel a gentle stretch deep in your back calf. Hold this position for __________ seconds. Repeat __________ times. Complete this stretch __________ times per day. STRETCH  Gastrocsoleus, Standing  Note: This exercise can place a lot of stress on your foot and ankle. Please complete this exercise only if specifically instructed by your caregiver.  Place the ball of your right / left foot on a step, keeping your other foot firmly on the same step. Hold on to the wall or a rail for balance. Slowly lift your other foot, allowing your body weight to press your heel down over  the edge of the step. You should feel a stretch in your right / left  calf. Hold this position for __________ seconds. Repeat this exercise with a slight bend in your right / left knee. Repeat __________ times. Complete this stretch __________ times per day.  STRENGTHENING EXERCISES - Plantar Fasciitis (Heel Spur Syndrome)  These exercises may help you when beginning to rehabilitate your injury. They may resolve your symptoms with or without further involvement from your physician, physical therapist or athletic trainer. While completing these exercises, remember:  Muscles can gain both the endurance and the strength needed for everyday activities through controlled exercises. Complete these exercises as instructed by your physician, physical therapist or athletic trainer. Progress the resistance and repetitions only as guided. STRENGTH - Towel Curls Sit in a chair positioned on a non-carpeted surface. Place your foot on a towel, keeping your heel on the floor. Pull the towel toward your heel by only curling your toes. Keep your heel on the floor. If instructed by your physician, physical therapist or athletic trainer, add ____________________ at the end of the towel. Repeat __________ times. Complete this exercise __________ times per day. STRENGTH - Ankle Inversion Secure one end of a rubber exercise band/tubing to a fixed object (table, pole). Loop the other end around your foot just before your toes. Place your fists between your knees. This will focus your strengthening at your ankle. Slowly, pull your big toe up and in, making sure the band/tubing is positioned to resist the entire motion. Hold this position for __________ seconds. Have your muscles resist the band/tubing as it slowly pulls your foot back to the starting position. Repeat __________ times. Complete this exercises __________ times per day.  Document Released: 11/20/2005 Document Revised: 02/12/2012 Document Reviewed:  03/04/2009 [facility] Patient Information 2013 [place], [place].

## 2012-09-15 NOTE — Progress Notes (Signed)
[address]   [place], [place]  [address]   [phone]  Subjective:    Patient ID: Miranda Knight, female    DOB: 04/01/1976, 36 y.o.   MRN: [id number]  HPIThis 36 y.o. female presents for evaluation of the following:  1. R heel pain: onset one week ago.  No injury; no new shoes.  No prolonged walking.  Desk job; Dealer.  No pain at rest.  Pain with weight bearing.  No ankle pain.   First steps in the morning the most painful; loosens up after a few steps.  Towards end of day feet swell.  No medication.  No ice or heat.  2.  L hip: with tingling into knee area.  Onset three days ago.  No injury or overuse; no heavy lifting; no sudden twisting or turning.  No back pain. Lateral hip pain; shoots down into frontal knee region.  +tingling; +painful with prolonged sitting.  No weakness.  Normal b/b function.  No saddle paresthesias.  No pain with laying on hip but does not sleep on that side.  No medications.   PMH:  Allergic Rhinitis; PCOS with irregular menses; LMP 03-2012; infertility. Psurg: L knee arthroscopy. Lasik. 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   Review of Systems  Constitutional: Negative for fever, chills, diaphoresis and fatigue.  Genitourinary: Negative for menstrual problem.  Musculoskeletal: Positive for myalgias and arthralgias. Negative for back pain and joint swelling.  Neurological: Negative for weakness and numbness.        No past medical history on file.  No past surgical history on file.  Prior to Admission medications   Medication Sig Start Date End Date Taking? Authorizing Provider  fluticasone (FLONASE) 50 MCG/ACT nasal spray Place 2 sprays into the nose daily. 01/21/12 01/20/13 Yes Lamar Laundry, MD  levocetirizine (XYZAL) 5 MG tablet Take 5 mg by mouth every evening.   Yes Historical Provider, MD  montelukast (SINGULAIR) 10 MG tablet Take 10 mg by mouth daily.    Yes Historical Provider, MD  Multiple Vitamins-Calcium (ONE-A-DAY WOMENS PO) Take 1 tablet by mouth daily.   Yes Historical Provider, MD    Allergies  Allergen Reactions  . Apple Itching and Cough  . Other Itching and Cough    almonds    History   Social History  . Marital Status: Married    Spouse Name: N/A    Number of Children: N/A  . Years of Education: N/A   Occupational History  . Not on file.   Social History Main Topics  . Smoking status: Never Smoker   . Smokeless tobacco: Not on file  . Alcohol Use: Not on file  . Drug Use: Not on file  . Sexually Active: Not on file   Other Topics Concern  . Not on file   Social History Narrative  . No narrative on file    No family history on file.  Objective:   Physical Exam  Constitutional: She appears well-developed and well-nourished. No distress.  Abdominal: Soft. Bowel sounds are normal. She exhibits no distension and no mass. There is no tenderness. There is no rebound and no guarding.  Musculoskeletal:       Right foot: She exhibits tenderness and bony tenderness. She exhibits normal range of motion, no swelling, normal capillary refill, no crepitus, no deformity and no laceration.       R FOOT/ANKLE:  +TTP R HEEL DIFFUSELY; PAIN WITH  PLANTAR FLEXION AND DORSIFLEXION; NON-TENDER ALONG METATARSALS.  NON-TENDER WITH METATARSAL SQUEEZE.  ANKLE NON-TENDER WITHOUT SWELLING.  LUMBAR SPINE: FULL ROM WITHOUT LIMITATION OR PAIN; STRAIGHT LEG RAISES NEGATIVE; MOTOR 5/5 BLE. L HIP:  +TTP LATERAL HIP; +TTP LATERAL ASPECT OF UPPER L LEG.   L KNEE: NO EFFUSION; NO BONY TTP; FULL ROM WITHOUT PAIN.  Neurological: She has normal strength. No sensory deficit.  Reflex Scores:      Patellar reflexes are 2+ on the right side and 2+ on the left side.      Achilles reflexes are 2+ on the right side and 2+ on the left side. Skin: Skin is warm and dry. She is not diaphoretic.  Psychiatric: She has a normal mood and affect. Her behavior  is normal. Judgment and thought content normal.      [facility] reading (PRIMARY) by  Dr. Katrinka Blazing.   R FOOT: NAD  L HIP: NAD  Results for orders placed in visit on 09/15/12  POCT URINE PREGNANCY      Component Value Range   Preg Test, Ur Negative      Assessment & Plan:   1. Pain of right heel  DG Foot 2 Views Right  2. Pain, joint, hip, left  DG Hip Complete Left  3. PCOS (polycystic ovarian syndrome)  POCT urine pregnancy  4. Plantar fasciitis of right foot    5. Iliotibial band syndrome      1.  R Plantar Fasciitis:  New.  Rest, ice, elevate, scheduled NSAIDs for two weeks.  Recommend heel cups and good supportive shoe.  If no improvement in one month, call for ortho referral.  Stretches provided to perform at home daily. 2.  L hip pain; New.  With radiation into distal thigh.  Ddx includes greater trochanteric bursitis versus iliotibial band syndrome.   3.  Iliotibial Band Syndrome L: New.  Versus greater trochanteric bursitis.  Rest, ice, NSAIDs scheduled for two weeks. Home exercises provided to perform daily. If no improvement in 2-4 weeks, to call for ortho referral.  Recommend Ibuprofen 600mg  tid. 4. PCOS: Stable. With chronic infertility and irregular menses; urine pregnancy negative.

## 2012-09-18 ENCOUNTER — Encounter: Payer: Self-pay | Admitting: Family Medicine

## 2012-11-18 ENCOUNTER — Ambulatory Visit (INDEPENDENT_AMBULATORY_CARE_PROVIDER_SITE_OTHER): Payer: BC Managed Care – PPO | Admitting: Obstetrics and Gynecology

## 2012-11-18 ENCOUNTER — Encounter: Payer: Self-pay | Admitting: Obstetrics and Gynecology

## 2012-11-18 VITALS — BP 118/74 | Ht 63.5 in | Wt 180.0 lb

## 2012-11-18 DIAGNOSIS — Z124 Encounter for screening for malignant neoplasm of cervix: Secondary | ICD-10-CM

## 2012-11-18 DIAGNOSIS — N970 Female infertility associated with anovulation: Secondary | ICD-10-CM | POA: Insufficient documentation

## 2012-11-18 DIAGNOSIS — Z01419 Encounter for gynecological examination (general) (routine) without abnormal findings: Secondary | ICD-10-CM

## 2012-11-18 DIAGNOSIS — N912 Amenorrhea, unspecified: Secondary | ICD-10-CM

## 2012-11-18 DIAGNOSIS — N915 Oligomenorrhea, unspecified: Secondary | ICD-10-CM

## 2012-11-18 MED ORDER — PROGESTERONE MICRONIZED 200 MG PO CAPS: ORAL_CAPSULE | ORAL | Status: DC

## 2012-11-18 NOTE — Progress Notes (Signed)
Last Pap: 11/14/2011 WNL: Yes Regular Periods:no Contraception: none  Monthly Breast exam:yes Tetanus<43yrs:yes Nl.Bladder Function:yes Daily BMs:yes Healthy Diet:yes Calcium:no Mammogram:no Date of Mammogram: n/a Exercise:no Have often Exercise: none Seatbelt: yes Abuse at home: no Stressful work:yes Sigmoid-colonoscopy: n/a Bone Density: No PCP: Dr Kellie Shropshire Change in PMH: no Change in FMH:no

## 2012-11-18 NOTE — Progress Notes (Signed)
Subjective:    Miranda Knight is a 36 y.o. female, G1P0, who presents for an annual exam. The patient reports continued oligomenorrhea (LMP-03/19/12) but no other complaints.   Menstrual cycle:   LMP: Patient's last menstrual period was 03/19/2012. Flow x 4 days with pad change 3/day and no cramps             Review of Systems Pertinent items are noted in HPI. Denies pelvic pain, urinary tract symptoms, vaginitis symptoms, irregular bleeding, menopausal symptoms, change in bowel habits or rectal bleeding   Objective:    BP 118/74  Ht 5' 3.5" (1.613 m)  Wt 180 lb (81.647 kg)  BMI 31.39 kg/m2  LMP 03/19/2012    Wt Readings from Last 1 Encounters:  11/18/12 180 lb (81.647 kg)   Body mass index is 31.39 kg/(m^2). General Appearance: Alert, no acute distress HEENT: Grossly normal Neck / Thyroid: Supple, no thyromegaly or cervical adenopathy Lungs: Clear to auscultation bilaterally Back: No CVA tenderness Breast Exam: No masses or nodes.No dimpling, nipple retraction or discharge. Cardiovascular: Regular rate and rhythm.  Gastrointestinal: Soft, non-tender, no masses or organomegaly Pelvic Exam: EGBUS-wnl, vagina-normal rugae, cervix- stenotic without lesions or tenderness, uterus appears normal size shape and consistency, adnexae-no masses or tenderness Lymphatic Exam: Non-palpable nodes in neck, clavicular,  axillary, or inguinal regions  Skin: no rashes or abnormalities Extremities: no clubbing cyanosis or edema  Neurologic: grossly normal Psychiatric: Alert and oriented  UPT-negative  Assessment:   Routine GYN Exam Oligomenorrhea/Amenorrhea Infertility   Plan:  Progesterone (micronized) 200 mg  #20  2 po qhs x 10 days no refill  Advise office if no menses within 10 days of completing medicaton  PAP sent  RTO 1 year or prn  Danely Bayliss,ELMIRAPA-C

## 2012-11-19 LAB — PAP IG W/ RFLX HPV ASCU

## 2013-01-20 ENCOUNTER — Ambulatory Visit: Payer: 59 | Admitting: Family Medicine

## 2013-01-20 VITALS — BP 122/84 | HR 85 | Temp 98.2°F | Resp 16 | Ht 65.0 in | Wt 173.0 lb

## 2013-01-20 DIAGNOSIS — J069 Acute upper respiratory infection, unspecified: Secondary | ICD-10-CM

## 2013-01-20 MED ORDER — HYDROCODONE-HOMATROPINE 5-1.5 MG/5ML PO SYRP: 5.0000 mL | ORAL_SOLUTION | ORAL | Status: DC | PRN

## 2013-01-20 MED ORDER — IPRATROPIUM BROMIDE 0.06 % NA SOLN: 2.0000 | Freq: Four times a day (QID) | NASAL | Status: DC

## 2013-01-20 MED ORDER — BENZONATATE 100 MG PO CAPS: ORAL_CAPSULE | ORAL | Status: DC

## 2013-01-20 NOTE — Progress Notes (Signed)
Subjective: Patient has developed a respiratory tract infection today. She has nasal congestion. She has facial pressure. She is tender in her neck. She is coughing. She's not been running a fever. She did get a flu shot this year. She is out of work on short-term disability because of foot surgery.  Objective: TMs are normal. She's tender maxillary and frontal sinus regions. Throat is erythematous without exudate. Neck supple without nodes. Nose is very congested. Chest clear to auscultation. Heart regular without murmurs.  Assessment: Viral URI  Plan: Treat symptomatically. Return if worse.

## 2013-01-20 NOTE — Patient Instructions (Signed)

## 2013-01-25 ENCOUNTER — Other Ambulatory Visit: Payer: Self-pay | Admitting: Family Medicine

## 2013-01-25 NOTE — Telephone Encounter (Signed)
Error

## 2013-02-27 ENCOUNTER — Emergency Department (INDEPENDENT_AMBULATORY_CARE_PROVIDER_SITE_OTHER)
Admission: EM | Admit: 2013-02-27 | Discharge: 2013-02-27 | Disposition: A | Discharge status: Home / Self Care | Payer: 59 | Source: Home / Self Care | Attending: Family Medicine | Admitting: Family Medicine

## 2013-02-27 ENCOUNTER — Encounter (HOSPITAL_COMMUNITY): Payer: Self-pay | Admitting: *Deleted

## 2013-02-27 DIAGNOSIS — J309 Allergic rhinitis, unspecified: Secondary | ICD-10-CM

## 2013-02-27 DIAGNOSIS — J302 Other seasonal allergic rhinitis: Secondary | ICD-10-CM

## 2013-02-27 MED ORDER — IPRATROPIUM BROMIDE 0.06 % NA SOLN: 2.0000 | Freq: Four times a day (QID) | NASAL | Status: DC

## 2013-02-27 MED ORDER — GUAIFENESIN ER 600 MG PO TB12: 1200.0000 mg | ORAL_TABLET | Freq: Two times a day (BID) | ORAL | Status: DC

## 2013-02-27 NOTE — ED Notes (Signed)
Pt  Reports  Symptoms  Of     Nasal         Congestion   And        Non  Productive  Cough           Pt  Reports  Had  Symptoms  Of  Fever  As  Well     Pt  Reports          She  Had      Fever  Symptoms         She  Reports  Pt  Has          Been taking  Tylenol          And  Sudafed  As  Well

## 2013-02-27 NOTE — ED Provider Notes (Signed)
History     CSN: [id number]  Arrival date & time 02/27/13  4098   First MD Initiated Contact with Patient 02/27/13 1857      Chief Complaint  Patient presents with  . Facial Pain    (Consider location/radiation/quality/duration/timing/severity/associated sxs/prior treatment) Patient is a 37 y.o. female presenting with URI. The history is provided by the patient.  URI Presenting symptoms: congestion, rhinorrhea and sore throat   Presenting symptoms: no cough and no fever   Severity:  Mild Progression:  Unchanged Chronicity:  New Ineffective treatments:  Decongestant Associated symptoms: sinus pain   Risk factors comment:  Seasonal allergies   Past Medical History  Diagnosis Date  . Allergy   . PCOS (polycystic ovarian syndrome)     with irregular menses, infertility  . Infertility associated with anovulation   . Migraine headache with aura   . Abnormal Pap smear     LEEP  . VAIN I (vaginal intraepithelial neoplasia grade I)     Past Surgical History  Procedure Laterality Date  . Eye surgery      Lasik  . Knee arthroscopy    . Wisdom tooth extraction    . Leep  2006    Family History  Problem Relation Age of Onset  . Hyperlipidemia Father   . Mental illness Maternal Grandmother   . Hypertension Maternal Grandfather   . Diabetes Maternal Grandfather   . Diabetes Paternal Grandfather     History  Substance Use Topics  . Smoking status: Never Smoker   . Smokeless tobacco: Never Used  . Alcohol Use: No    OB History   Grav Para Term Preterm Abortions TAB SAB Ect Mult Living   1         0      Review of Systems  Constitutional: Negative.  Negative for fever.  HENT: Positive for congestion, sore throat, rhinorrhea and postnasal drip.   Respiratory: Negative for cough and chest tightness.   Cardiovascular: Negative.     Allergies  Apple and Other  Home Medications   Current Outpatient Rx  Name  Route  Sig  Dispense  Refill  . benzonatate  (TESSALON) 100 MG capsule      TAKE 1-2 CAPSULES BY MOUTH 3 TIMES A DAY AS NEEDED FOR COUGH. MAY BE USED WITH OTHER COUGH MEDICINES   30 capsule   0     Needs office visit if additional refills requested   . EXPIRED: fluticasone (FLONASE) 50 MCG/ACT nasal spray   Nasal   Place 2 sprays into the nose daily.   16 g   6   . guaiFENesin (MUCINEX) 600 MG 12 hr tablet   Oral   Take 2 tablets (1,200 mg total) by mouth 2 (two) times daily.   30 tablet   1   . HYDROcodone-homatropine (HYCODAN) 5-1.5 MG/5ML syrup   Oral   Take 5 mLs by mouth every 4 (four) hours as needed for cough.   120 mL   0   . ipratropium (ATROVENT) 0.06 % nasal spray   Nasal   Place 2 sprays into the nose 4 (four) times daily.   15 mL   12   . ipratropium (ATROVENT) 0.06 % nasal spray   Nasal   Place 2 sprays into the nose 4 (four) times daily.   15 mL   1   . levocetirizine (XYZAL) 5 MG tablet   Oral   Take 5 mg by mouth every evening.         Marland Kitchen  montelukast (SINGULAIR) 10 MG tablet   Oral   Take 10 mg by mouth daily.         . Multiple Vitamins-Calcium (ONE-A-DAY WOMENS PO)   Oral   Take 1 tablet by mouth daily.         . progesterone (PROMETRIUM) 200 MG capsule      2 tablets qhs x 10 days   20 capsule   0     BP 133/89  Pulse 79  Temp(Src) 98.7 F (37.1 C) (Oral)  Resp 18  SpO2 98%  Physical Exam  Nursing note and vitals reviewed. Constitutional: She is oriented to person, place, and time. She appears well-developed and well-nourished.  Neck: Normal range of motion. Neck supple.  Pulmonary/Chest: Breath sounds normal. She has no wheezes.  Lymphadenopathy:    She has cervical adenopathy.  Neurological: She is alert and oriented to person, place, and time.  Skin: Skin is warm and dry.    ED Course  Procedures (including critical care time)  Labs Reviewed - No data to display No results found.   1. Seasonal allergic rhinitis       MDM          Linna Hoff, MD 02/27/13 2020

## 2014-02-24 ENCOUNTER — Other Ambulatory Visit: Payer: Self-pay | Admitting: Family

## 2014-02-24 DIAGNOSIS — G43909 Migraine, unspecified, not intractable, without status migrainosus: Secondary | ICD-10-CM

## 2014-03-02 ENCOUNTER — Ambulatory Visit
Admission: RE | Admit: 2014-03-02 | Discharge: 2014-03-02 | Disposition: A | Discharge status: Home / Self Care | Payer: BC Managed Care – PPO | Source: Ambulatory Visit | Attending: Family | Admitting: Family

## 2014-03-02 DIAGNOSIS — G43909 Migraine, unspecified, not intractable, without status migrainosus: Secondary | ICD-10-CM

## 2014-04-14 ENCOUNTER — Encounter: Payer: Self-pay | Admitting: Gynecology

## 2014-04-14 ENCOUNTER — Ambulatory Visit (INDEPENDENT_AMBULATORY_CARE_PROVIDER_SITE_OTHER): Payer: BC Managed Care – PPO | Admitting: Gynecology

## 2014-04-14 VITALS — BP 124/78 | Ht 64.0 in | Wt 167.0 lb

## 2014-04-14 DIAGNOSIS — Z8742 Personal history of other diseases of the female genital tract: Secondary | ICD-10-CM

## 2014-04-14 DIAGNOSIS — N938 Other specified abnormal uterine and vaginal bleeding: Secondary | ICD-10-CM

## 2014-04-14 DIAGNOSIS — N949 Unspecified condition associated with female genital organs and menstrual cycle: Secondary | ICD-10-CM

## 2014-04-14 LAB — CBC WITH DIFFERENTIAL/PLATELET
Basophils Absolute: 0.1 10*3/uL (ref 0.0–0.1)
Basophils Relative: 1 % (ref 0–1)
Eosinophils Absolute: 0.1 10*3/uL (ref 0.0–0.7)
Eosinophils Relative: 1 % (ref 0–5)
HEMATOCRIT: 37.2 % (ref 36.0–46.0)
HEMOGLOBIN: 12.7 g/dL (ref 12.0–15.0)
LYMPHS ABS: 1.6 10*3/uL (ref 0.7–4.0)
Lymphocytes Relative: 26 % (ref 12–46)
MCH: 31.8 pg (ref 26.0–34.0)
MCHC: 34.1 g/dL (ref 30.0–36.0)
MCV: 93 fL (ref 78.0–100.0)
MONOS PCT: 9 % (ref 3–12)
Monocytes Absolute: 0.5 10*3/uL (ref 0.1–1.0)
NEUTROS ABS: 3.8 10*3/uL (ref 1.7–7.7)
NEUTROS PCT: 63 % (ref 43–77)
Platelets: 269 10*3/uL (ref 150–400)
RBC: 4 MIL/uL (ref 3.87–5.11)
RDW: 13.6 % (ref 11.5–15.5)
WBC: 6.1 10*3/uL (ref 4.0–10.5)

## 2014-04-14 LAB — PREGNANCY, URINE: PREG TEST UR: NEGATIVE

## 2014-04-14 MED ORDER — MEGESTROL ACETATE 40 MG PO TABS: 40.0000 mg | ORAL_TABLET | Freq: Two times a day (BID) | ORAL | Status: DC

## 2014-04-14 NOTE — Patient Instructions (Signed)

## 2014-04-14 NOTE — Progress Notes (Signed)
   Patient is a 38 year old new patient to the practice who was here today with a complaint of irregular vaginal bleeding. 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 She stated in the past she had been on 4 cycles of clomiphene citrate and Femera. Patient has had history of PCO S. and is gone as much as cycles every 5 months. She was recently started on Provera to start her cycles and she had been bleeding every 2 weeks and had some basic blood work drawn by her prior gynecologist and never received any followup and this is the reason for her office visit today.  The patient had a LEEP cervical conization for low-grade SIL in 2006 and states that her Pap smears that were normal. She also reports a normal Pap smear this March. The patient had the following labs drawn at another facility on March 10 of this year: RPR, HSV, prolactin, TSH, Pap smear, GC and Chlamydia, hepatitis B and C. and HIV testing were all normal.  Urine pregnancy test today was negative  Exam: Abdomen: Soft nontender no rebound or guarding Pelvic: Bartholin urethra Skene glands within normal limits vagina: Dark brown blood present Cervix: No active bleeding Uterus: Anteverted normal size shape and consistency Adnexa: No palpable mass or tenderness Rectal exam not done  Assessment/plan: Patient with dysfunction uterine bleeding after starting Provera as a result of her oligomenorrhea/PCOS syndrome. Patient has never had a sonohysterogram in the past only HSG several years ago. She stated that her partner many years ago also had a normal semen analysis. Since her pregnancy test was negative today she was prescribed Megace 40 mg to take one by mouth twice a day for 7 days and she will use contraception during that time and we will see her next week for sonohysterogram to better assess the uterine cavity and possible  endometrial biopsy. A CBC will also be drawn today. Management will be determined after the above studies. Of note patient has put on in vitro fertilization.

## 2014-04-14 NOTE — Addendum Note (Signed)
Addended by: Berna SpareASTILLO, BLANCA A on: 04/14/2014 02:31 PM   Modules accepted: Orders

## 2014-04-24 ENCOUNTER — Ambulatory Visit (INDEPENDENT_AMBULATORY_CARE_PROVIDER_SITE_OTHER): Payer: BC Managed Care – PPO

## 2014-04-24 ENCOUNTER — Other Ambulatory Visit: Payer: Self-pay | Admitting: Gynecology

## 2014-04-24 ENCOUNTER — Ambulatory Visit (INDEPENDENT_AMBULATORY_CARE_PROVIDER_SITE_OTHER): Payer: BC Managed Care – PPO | Admitting: Gynecology

## 2014-04-24 DIAGNOSIS — N949 Unspecified condition associated with female genital organs and menstrual cycle: Secondary | ICD-10-CM

## 2014-04-24 DIAGNOSIS — N979 Female infertility, unspecified: Secondary | ICD-10-CM

## 2014-04-24 DIAGNOSIS — E282 Polycystic ovarian syndrome: Secondary | ICD-10-CM

## 2014-04-24 DIAGNOSIS — N83209 Unspecified ovarian cyst, unspecified side: Secondary | ICD-10-CM

## 2014-04-24 DIAGNOSIS — N938 Other specified abnormal uterine and vaginal bleeding: Secondary | ICD-10-CM

## 2014-04-24 DIAGNOSIS — N925 Other specified irregular menstruation: Secondary | ICD-10-CM

## 2014-04-24 DIAGNOSIS — N83201 Unspecified ovarian cyst, right side: Secondary | ICD-10-CM

## 2014-04-24 DIAGNOSIS — Z8742 Personal history of other diseases of the female genital tract: Secondary | ICD-10-CM

## 2014-04-24 MED ORDER — MEDROXYPROGESTERONE ACETATE 10 MG PO TABS: ORAL_TABLET | ORAL | Status: DC

## 2014-04-24 NOTE — Progress Notes (Signed)
   38 year old was seen as a new patient in the office on May 12 as a result of her irregular vaginal bleeding. 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 She stated in the past she had been on 4 cycles of clomiphene citrate and Femera. Patient has had history of PCO S. and is gone as much as cycles every 5 months. She was recently started on Provera to start her cycles and she had been bleeding every 2 weeks.  The patient had a LEEP cervical conization for low-grade SIL in 2006 and states that her Pap smears that were normal. She also reports a normal Pap smear this March. The patient had the following labs drawn at another facility on March 10 of this year:   RPR, HSV, prolactin, TSH, Pap smear, GC and Chlamydia, hepatitis B and C. and HIV testing were all normal.  Patient at last office visit was started on Megace 40 mg 1 by mouth twice a day was instructed to return today for a sonohysterogram endometrial biopsy. Her urine pregnancy test was negative on that office visit. Her CBC was normal.  Ultrasound sonohysterogram today: Uterus measures 6.8 x 4.6 x 3.8 cm with endometrial stripe of 5.3 mm. Right ovarian follicle cyst measuring 20 x 22 mm. Otherwise normal-appearing ovaries PCO type no free fluid seen in the cul-de-sac otherwise adnexa were normal.  The cervix was then cleansed with Betadine solution. A single-tooth tenaculum was placed on the anterior cervical lip. A sterile Pipelle was introduced to the uterine cavity and moderate amount of tissue was obtained and submitted for histological evaluation.  Assessment/plan: Patient with primary infertility oligomenorrhea/PCO S who has gone through several cycles of IVF in the past. Patient not interested at the present time. Patient was to her prenatal vitamin position using ovulation predictor kit timed  intercourse. She will be prescribed Provera 10 mg to take for 10 days if she does not have a spontaneous menses q. 35 days providing that she does a urine pregnancy test prior to taking the Provera. Endometrial biopsy was done today results pending at time of this dictation.

## 2014-05-20 ENCOUNTER — Telehealth: Payer: Self-pay

## 2014-05-20 ENCOUNTER — Encounter: Payer: Self-pay | Admitting: Gynecology

## 2014-05-20 NOTE — Telephone Encounter (Signed)
Patient sent following email to Dr. Glenetta HewJF- "My period came on June 1 and June 15. What do I need to do? Thanks,  Miranda Knight"  (Recently seen for DUB)

## 2014-05-20 NOTE — Telephone Encounter (Signed)
Please make sure that she has a negative urine pregnancy test.: Prescription for Alesse 28 Day OCP. She started today. Prescribed 30 tablets with 11 refills

## 2014-05-20 NOTE — Telephone Encounter (Signed)
Patient had normal ultrasound and endometrial biopsy last month. This bleeding may be as a result of her PCO S. When to cycle her with a low dose oral contraceptive for 6 months before she attempts to get pregnant again. Lid she would like to do this.

## 2014-05-20 NOTE — Telephone Encounter (Signed)
Emailed patient with this response.

## 2014-05-20 NOTE — Telephone Encounter (Signed)
Patient emailed back that she would like to try OC's for 6 mos.  Please advised regarding Rx and when she should start them.

## 2014-05-21 ENCOUNTER — Other Ambulatory Visit: Payer: Self-pay | Admitting: Gynecology

## 2014-05-21 MED ORDER — LEVONORGESTREL-ETHINYL ESTRAD 0.1-20 MG-MCG PO TABS: 1.0000 | ORAL_TABLET | Freq: Every day | ORAL | Status: DC

## 2014-05-21 NOTE — Telephone Encounter (Signed)
Emailed patient back with instructions. Rx sent.

## 2014-10-27 ENCOUNTER — Other Ambulatory Visit: Payer: Self-pay | Admitting: Gynecology

## 2015-04-16 ENCOUNTER — Ambulatory Visit (INDEPENDENT_AMBULATORY_CARE_PROVIDER_SITE_OTHER): Payer: BC Managed Care – PPO | Admitting: Gynecology

## 2015-04-16 ENCOUNTER — Other Ambulatory Visit (HOSPITAL_COMMUNITY)
Admission: RE | Admit: 2015-04-16 | Discharge: 2015-04-16 | Disposition: A | Discharge status: Home / Self Care | Payer: BC Managed Care – PPO | Source: Ambulatory Visit | Attending: Gynecology | Admitting: Gynecology

## 2015-04-16 ENCOUNTER — Encounter: Payer: Self-pay | Admitting: Gynecology

## 2015-04-16 VITALS — BP 128/80 | Ht 64.5 in | Wt 168.0 lb

## 2015-04-16 DIAGNOSIS — Z1151 Encounter for screening for human papillomavirus (HPV): Secondary | ICD-10-CM | POA: Diagnosis present

## 2015-04-16 DIAGNOSIS — Z01419 Encounter for gynecological examination (general) (routine) without abnormal findings: Secondary | ICD-10-CM | POA: Diagnosis not present

## 2015-04-16 MED ORDER — LEVONORGESTREL-ETHINYL ESTRAD 0.1-20 MG-MCG PO TABS: 1.0000 | ORAL_TABLET | Freq: Every day | ORAL | Status: DC

## 2015-04-16 NOTE — Progress Notes (Signed)
Miranda Knight [birth year] [id number]   History:    39 y.o.  for annual gyn exam who was seen last year for the first time as a new patient. Patient is asymptomatic today. Patient with history of PCOS currently on oral contraceptive pill having normal menstrual cycle. Please see previous detail note on office visit 04/24/2014.  The patient had a LEEP cervical conization for low-grade SIL in 2006 and states that her Pap smears that were normal. Last year patient had some dysfunctional uterine bleeding and had a normal endometrial biopsy and normal sonohysterogram with the exception of a small right follicle cyst on the right ovary otherwise normal and ovaries with he PCOS type appearance.  Past medical history,surgical history, family history and social history were all reviewed and documented in the EPIC chart.  Gynecologic History Patient's last menstrual period was 03/20/2015. Contraception: OCP (estrogen/progesterone) Last Pap: 2013. Results were: normal Last mammogram: Not indicated. Results were: Not indicated  Obstetric History OB History  Gravida Para Term Preterm AB SAB TAB Ectopic Multiple Living  1    1  1    0    # Outcome Date GA Lbr Len/2nd Weight Sex Delivery Anes PTL Lv  1 TAB                ROS: A ROS was performed and pertinent positives and negatives are included in the history.  GENERAL: No fevers or chills. HEENT: No change in vision, no earache, sore throat or sinus congestion. NECK: No pain or stiffness. CARDIOVASCULAR: No chest pain or pressure. No palpitations. PULMONARY: No shortness of breath, cough or wheeze. GASTROINTESTINAL: No abdominal pain, nausea, vomiting or diarrhea, melena or bright red blood per rectum. GENITOURINARY: No urinary frequency, urgency, hesitancy or dysuria. MUSCULOSKELETAL: No joint or muscle pain, no back pain, no recent trauma. DERMATOLOGIC: No rash, no itching, no lesions. ENDOCRINE: No polyuria, polydipsia, no heat or cold  intolerance. No recent change in weight. HEMATOLOGICAL: No anemia or easy bruising or bleeding. NEUROLOGIC: No headache, seizures, numbness, tingling or weakness. PSYCHIATRIC: No depression, no loss of interest in normal activity or change in sleep pattern.     Exam: chaperone present  BP 128/80 mmHg  Ht 5' 4.5" (1.638 m)  Wt 168 lb (76.204 kg)  BMI 28.40 kg/m2  LMP 03/20/2015  Body mass index is 28.4 kg/(m^2).  General appearance : Well developed well nourished female. No acute distress HEENT: Eyes: no retinal hemorrhage or exudates,  Neck supple, trachea midline, no carotid bruits, no thyroidmegaly Lungs: Clear to auscultation, no rhonchi or wheezes, or rib retractions  Heart: Regular rate and rhythm, no murmurs or gallops Breast:Examined in sitting and supine position were symmetrical in appearance, no palpable masses or tenderness,  no skin retraction, no nipple inversion, no nipple discharge, no skin discoloration, no axillary or supraclavicular lymphadenopathy Abdomen: no palpable masses or tenderness, no rebound or guarding Extremities: no edema or skin discoloration or tenderness  Pelvic:  Bartholin, Urethra, Skene Glands: Within normal limits             Vagina: No gross lesions or discharge  Cervix: No gross lesions or discharge  Uterus  anteverted, normal size, shape and consistency, non-tender and mobile  Adnexa  Without masses or tenderness  Anus and perineum  normal   Rectovaginal  normal sphincter tone without palpated masses or tenderness             Hemoccult not indicated     Assessment/Plan:  38  y.o. female for annual exam with history of PCOS doing well with oral contraceptive pill. Patient not interested in getting pregnant at the present time. She will return back to the office next week for the following fasting screening blood work: Comprehensive metabolic panel, fasting lipid profile, TSH, CBC, and urinalysis. Pap smear with HPV screening was done today.  Patient was reminded to do her monthly breast exams. Patient's Tdap vaccine was approximately 9 years ago.   Ok EdwardsFERNANDEZ,Chelsie Burel H MD, 9:00 AM 04/16/2015

## 2015-04-19 LAB — CYTOLOGY - PAP

## 2015-04-23 ENCOUNTER — Other Ambulatory Visit: Payer: BC Managed Care – PPO

## 2015-04-23 DIAGNOSIS — Z01419 Encounter for gynecological examination (general) (routine) without abnormal findings: Secondary | ICD-10-CM

## 2015-04-23 LAB — COMPREHENSIVE METABOLIC PANEL WITH GFR
ALT: 35 U/L (ref 0–35)
AST: 25 U/L (ref 0–37)
Albumin: 3.6 g/dL (ref 3.5–5.2)
Alkaline Phosphatase: 48 U/L (ref 39–117)
BUN: 8 mg/dL (ref 6–23)
CO2: 30 meq/L (ref 19–32)
Calcium: 9.1 mg/dL (ref 8.4–10.5)
Chloride: 106 meq/L (ref 96–112)
Creat: 0.67 mg/dL (ref 0.50–1.10)
Glucose, Bld: 75 mg/dL (ref 70–99)
Potassium: 4.6 meq/L (ref 3.5–5.3)
Sodium: 141 meq/L (ref 135–145)
Total Bilirubin: 0.6 mg/dL (ref 0.2–1.2)
Total Protein: 6.1 g/dL (ref 6.0–8.3)

## 2015-04-23 LAB — CBC WITH DIFFERENTIAL/PLATELET
BASOS PCT: 1 % (ref 0–1)
Basophils Absolute: 0.1 10*3/uL (ref 0.0–0.1)
EOS ABS: 0.1 10*3/uL (ref 0.0–0.7)
EOS PCT: 2 % (ref 0–5)
HEMATOCRIT: 39.1 % (ref 36.0–46.0)
Hemoglobin: 13.3 g/dL (ref 12.0–15.0)
LYMPHS ABS: 2.6 10*3/uL (ref 0.7–4.0)
Lymphocytes Relative: 44 % (ref 12–46)
MCH: 32.4 pg (ref 26.0–34.0)
MCHC: 34 g/dL (ref 30.0–36.0)
MCV: 95.1 fL (ref 78.0–100.0)
MPV: 10.2 fL (ref 8.6–12.4)
Monocytes Absolute: 0.5 10*3/uL (ref 0.1–1.0)
Monocytes Relative: 9 % (ref 3–12)
NEUTROS PCT: 44 % (ref 43–77)
Neutro Abs: 2.6 10*3/uL (ref 1.7–7.7)
PLATELETS: 282 10*3/uL (ref 150–400)
RBC: 4.11 MIL/uL (ref 3.87–5.11)
RDW: 13.3 % (ref 11.5–15.5)
WBC: 5.9 10*3/uL (ref 4.0–10.5)

## 2015-04-23 LAB — LIPID PANEL
Cholesterol: 162 mg/dL (ref 0–200)
HDL: 38 mg/dL — ABNORMAL LOW
LDL Cholesterol: 111 mg/dL — ABNORMAL HIGH (ref 0–99)
Total CHOL/HDL Ratio: 4.3 ratio
Triglycerides: 63 mg/dL
VLDL: 13 mg/dL (ref 0–40)

## 2015-04-23 LAB — TSH: TSH: 1.423 u[IU]/mL (ref 0.350–4.500)

## 2015-04-24 LAB — URINALYSIS W MICROSCOPIC + REFLEX CULTURE
Bilirubin Urine: NEGATIVE
CRYSTALS: NONE SEEN
Casts: NONE SEEN
Glucose, UA: NEGATIVE mg/dL
Hgb urine dipstick: NEGATIVE
KETONES UR: NEGATIVE mg/dL
Leukocytes, UA: NEGATIVE
NITRITE: NEGATIVE
PROTEIN: NEGATIVE mg/dL
Specific Gravity, Urine: 1.017 (ref 1.005–1.030)
UROBILINOGEN UA: 0.2 mg/dL (ref 0.0–1.0)
pH: 7.5 (ref 5.0–8.0)

## 2015-04-26 ENCOUNTER — Other Ambulatory Visit: Payer: BC Managed Care – PPO

## 2016-01-03 ENCOUNTER — Emergency Department (HOSPITAL_COMMUNITY)
Admission: EM | Admit: 2016-01-03 | Discharge: 2016-01-03 | Discharge status: Absent without leave | Payer: BC Managed Care – PPO | Source: Home / Self Care

## 2016-04-17 ENCOUNTER — Ambulatory Visit (INDEPENDENT_AMBULATORY_CARE_PROVIDER_SITE_OTHER): Payer: BC Managed Care – PPO | Admitting: Gynecology

## 2016-04-17 ENCOUNTER — Encounter: Payer: Self-pay | Admitting: Gynecology

## 2016-04-17 VITALS — BP 122/80 | Ht 64.0 in | Wt 166.0 lb

## 2016-04-17 DIAGNOSIS — G43829 Menstrual migraine, not intractable, without status migrainosus: Secondary | ICD-10-CM | POA: Diagnosis not present

## 2016-04-17 DIAGNOSIS — Z01419 Encounter for gynecological examination (general) (routine) without abnormal findings: Secondary | ICD-10-CM | POA: Diagnosis not present

## 2016-04-17 DIAGNOSIS — Z87411 Personal history of vaginal dysplasia: Secondary | ICD-10-CM | POA: Diagnosis not present

## 2016-04-17 DIAGNOSIS — N926 Irregular menstruation, unspecified: Secondary | ICD-10-CM

## 2016-04-17 DIAGNOSIS — Z87410 Personal history of cervical dysplasia: Secondary | ICD-10-CM | POA: Diagnosis not present

## 2016-04-17 DIAGNOSIS — N943 Premenstrual tension syndrome: Secondary | ICD-10-CM

## 2016-04-17 LAB — PREGNANCY, URINE: PREG TEST UR: NEGATIVE

## 2016-04-17 MED ORDER — LEVONORGESTREL-ETHINYL ESTRAD 0.1-20 MG-MCG PO TABS: ORAL_TABLET | ORAL | Status: DC

## 2016-04-17 NOTE — Progress Notes (Signed)
Miranda Knight 16-Oct-1976 [id number]   History:    40 y.o.  for annual gyn exam who had been having normal menstrual cycles until the last 2 menstrual cycles which she stated that a few days before she became nauseous and had headache. She reports normal compliance. A urine pregnancy test in the office was negative.Patient with history of PCOS currently on oral contraceptive pill having normal menstrual cycle. Please see previous detail note on office visit 04/24/2014.  The patient had a LEEP cervical conization for low-grade SIL in 2006 and states that her Pap smears that were normal. Last year patient had some dysfunctional uterine bleeding and had a normal endometrial biopsy and normal sonohysterogram with the exception of a small right follicle cyst on the right ovary otherwise normal and ovaries with he PCOS type appearance. Her PCP has been doing her blood work Dr. Renae Gloss.  Past medical history,surgical history, family history and social history were all reviewed and documented in the EPIC chart.  Gynecologic History Patient's last menstrual period was 03/20/2016. Contraception: OCP (estrogen/progesterone) Last Pap: 2016. Results were: normal Last mammogram: Not indicated. Results were: Not indicated  Obstetric History OB History  Gravida Para Term Preterm AB SAB TAB Ectopic Multiple Living  0    # Outcome Date GA Lbr Len/2nd Weight Sex Delivery Anes PTL Lv  1 TAB                ROS: A ROS was performed and pertinent positives and negatives are included in the history.  GENERAL: No fevers or chills. HEENT: No change in vision, no earache, sore throat or sinus congestion. NECK: No pain or stiffness. CARDIOVASCULAR: No chest pain or pressure. No palpitations. PULMONARY: No shortness of breath, cough or wheeze. GASTROINTESTINAL: No abdominal pain, nausea, vomiting or diarrhea, melena or bright red blood per rectum. GENITOURINARY: No urinary frequency, urgency,  hesitancy or dysuria. MUSCULOSKELETAL: No joint or muscle pain, no back pain, no recent trauma. DERMATOLOGIC: No rash, no itching, no lesions. ENDOCRINE: No polyuria, polydipsia, no heat or cold intolerance. No recent change in weight. HEMATOLOGICAL: No anemia or easy bruising or bleeding. NEUROLOGIC: No headache, seizures, numbness, tingling or weakness. PSYCHIATRIC: No depression, no loss of interest in normal activity or change in sleep pattern.     Exam: chaperone present  BP 122/80 mmHg  Ht  (1.626 m)  Wt 166 lb (75.297 kg)  BMI 28.48 kg/m2  LMP 03/20/2016  Body mass index is 28.48 kg/(m^2).  General appearance : Well developed well nourished female. No acute distress HEENT: Eyes: no retinal hemorrhage or exudates,  Neck supple, trachea midline, no carotid bruits, no thyroidmegaly Lungs: Clear to auscultation, no rhonchi or wheezes, or rib retractions  Heart: Regular rate and rhythm, no murmurs or gallops Breast:Examined in sitting and supine position were symmetrical in appearance, no palpable masses or tenderness,  no skin retraction, no nipple inversion, no nipple discharge, no skin discoloration, no axillary or supraclavicular lymphadenopathy Abdomen: no palpable masses or tenderness, no rebound or guarding Extremities: no edema or skin discoloration or tenderness  Pelvic:  Bartholin, Urethra, Skene Glands: Within normal limits             Vagina: No gross lesions or discharge  Cervix: No gross lesions or discharge  Uterus  anteverted, normal size, shape and consistency, non-tender and mobile  Adnexa  Without masses or tenderness  Anus and perineum  normal  Rectovaginal  normal sphincter tone without palpated masses or tenderness             Hemoccult not indicated     Assessment/Plan:  40 y.o. 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 I'm  going to recommend for her to take her oral contraceptive pills continuous and withdrawal every 3 months. She is on a 20 g oral contraceptive pill. Her Pap smear was done today without HPV. Her PCP is doing her blood work. She was encouraged to do her monthly breast exams.   Ok EdwardsFERNANDEZ,JUAN H MD, 9:19 AM 04/17/2016

## 2016-04-18 LAB — PAP IG W/ RFLX HPV ASCU

## 2016-05-12 ENCOUNTER — Other Ambulatory Visit: Payer: Self-pay | Admitting: Gynecology

## 2017-04-03 DIAGNOSIS — R87610 Atypical squamous cells of undetermined significance on cytologic smear of cervix (ASC-US): Secondary | ICD-10-CM

## 2017-04-03 HISTORY — DX: Atypical squamous cells of undetermined significance on cytologic smear of cervix (ASC-US): R87.610

## 2017-04-16 ENCOUNTER — Other Ambulatory Visit: Payer: Self-pay | Admitting: Gynecology

## 2017-04-18 ENCOUNTER — Encounter: Payer: Self-pay | Admitting: Gynecology

## 2017-04-18 ENCOUNTER — Encounter: Payer: BC Managed Care – PPO | Admitting: Gynecology

## 2017-04-20 ENCOUNTER — Encounter: Payer: BC Managed Care – PPO | Admitting: Gynecology

## 2017-04-25 ENCOUNTER — Encounter: Payer: Self-pay | Admitting: Gynecology

## 2017-04-25 ENCOUNTER — Ambulatory Visit (INDEPENDENT_AMBULATORY_CARE_PROVIDER_SITE_OTHER): Payer: BC Managed Care – PPO | Admitting: Gynecology

## 2017-04-25 VITALS — BP 118/72 | Ht 65.5 in | Wt 172.0 lb

## 2017-04-25 DIAGNOSIS — Z87411 Personal history of vaginal dysplasia: Secondary | ICD-10-CM | POA: Diagnosis not present

## 2017-04-25 DIAGNOSIS — Z01419 Encounter for gynecological examination (general) (routine) without abnormal findings: Secondary | ICD-10-CM | POA: Diagnosis not present

## 2017-04-25 DIAGNOSIS — Z87410 Personal history of cervical dysplasia: Secondary | ICD-10-CM | POA: Diagnosis not present

## 2017-04-25 MED ORDER — LEVONORGESTREL-ETHINYL ESTRAD 0.1-20 MG-MCG PO TABS: 1.0000 | ORAL_TABLET | Freq: Every day | ORAL | 4 refills | Status: DC

## 2017-04-25 NOTE — Progress Notes (Signed)
Miranda Knight Aug 10, 1976 [id number]   History:    41 y.o.  for annual gyn exam with no complaints today. Patient with past history of PCOS has done well on continuous 20 g oral contraceptive pill which she takes continuously and withdrawals every 3 months.The patient had a LEEP cervical conization for low-grade SIL in 2006 and states that her Pap smears that were normal.  Past medical history,surgical history, family history and social history were all reviewed and documented in the EPIC chart.  Gynecologic History Patient's last menstrual period was 04/19/2017. Contraception: OCP (estrogen/progesterone) Last Pap: 2017. Results were: normal Last mammogram: Not indicated. Results were: Not indicated  Obstetric History OB History  Gravida Para Term Preterm AB Living  1       1 0  SAB TAB Ectopic Multiple Live Births    1          # Outcome Date GA Lbr Len/2nd Weight Sex Delivery Anes PTL Lv  1 TAB                ROS: A ROS was performed and pertinent positives and negatives are included in the history.  GENERAL: No fevers or chills. HEENT: No change in vision, no earache, sore throat or sinus congestion. NECK: No pain or stiffness. CARDIOVASCULAR: No chest pain or pressure. No palpitations. PULMONARY: No shortness of breath, cough or wheeze. GASTROINTESTINAL: No abdominal pain, nausea, vomiting or diarrhea, melena or bright red blood per rectum. GENITOURINARY: No urinary frequency, urgency, hesitancy or dysuria. MUSCULOSKELETAL: No joint or muscle pain, no back pain, no recent trauma. DERMATOLOGIC: No rash, no itching, no lesions. ENDOCRINE: No polyuria, polydipsia, no heat or cold intolerance. No recent change in weight. HEMATOLOGICAL: No anemia or easy bruising or bleeding. NEUROLOGIC: No headache, seizures, numbness, tingling or weakness. PSYCHIATRIC: No depression, no loss of interest in normal activity or change in sleep pattern.     Exam: chaperone present  BP 118/72   Ht  5' 5.5" (1.664 m)   Wt 172 lb (78 kg)   LMP 04/19/2017   BMI 28.19 kg/m   Body mass index is 28.19 kg/m.  General appearance : Well developed well nourished female. No acute distress HEENT: Eyes: no retinal hemorrhage or exudates,  Neck supple, trachea midline, no carotid bruits, no thyroidmegaly Lungs: Clear to auscultation, no rhonchi or wheezes, or rib retractions  Heart: Regular rate and rhythm, no murmurs or gallops Breast:Examined in sitting and supine position were symmetrical in appearance, no palpable masses or tenderness,  no skin retraction, no nipple inversion, no nipple discharge, no skin discoloration, no axillary or supraclavicular lymphadenopathy Abdomen: no palpable masses or tenderness, no rebound or guarding Extremities: no edema or skin discoloration or tenderness  Pelvic:  Bartholin, Urethra, Skene Glands: Within normal limits             Vagina: No gross lesions or discharge  Cervix: No gross lesions or discharge  Uterus  anteverted, normal size, shape and consistency, non-tender and mobile  Adnexa  Without masses or tenderness  Anus and perineum  normal   Rectovaginal  normal sphincter tone without palpated masses or tenderness             Hemoccult not indicated     Assessment/Plan:  41 y.o. female for annual exam doing well on continuous oral contraceptive pill and would drawn every 3 months. Her PCP has been doing her blood work. 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 Pap smear was done today. She will be 41 years old in November at which time she will schedule her first mammogram.   Ok EdwardsFERNANDEZ,Equilla Que H MD, 8:18 AM 04/25/2017

## 2017-04-25 NOTE — Addendum Note (Signed)
Addended by: Dayna BarkerGARDNER, Shuree Brossart K on: 04/25/2017 10:20 AM   Modules accepted: Orders

## 2017-04-26 LAB — PAP IG W/ RFLX HPV ASCU

## 2017-04-27 LAB — HUMAN PAPILLOMAVIRUS, HIGH RISK: HPV DNA HIGH RISK: NOT DETECTED

## 2017-05-02 ENCOUNTER — Encounter: Payer: Self-pay | Admitting: Gynecology

## 2017-07-07 ENCOUNTER — Other Ambulatory Visit: Payer: Self-pay | Admitting: Gynecology

## 2017-11-14 ENCOUNTER — Other Ambulatory Visit: Payer: Self-pay | Admitting: Women's Health

## 2017-11-14 DIAGNOSIS — Z139 Encounter for screening, unspecified: Secondary | ICD-10-CM

## 2017-12-04 HISTORY — PX: BREAST BIOPSY: SHX20

## 2017-12-14 ENCOUNTER — Ambulatory Visit
Admission: RE | Admit: 2017-12-14 | Discharge: 2017-12-14 | Disposition: A | Discharge status: Home / Self Care | Payer: BC Managed Care – PPO | Source: Ambulatory Visit | Attending: Women's Health | Admitting: Women's Health

## 2017-12-14 DIAGNOSIS — Z139 Encounter for screening, unspecified: Secondary | ICD-10-CM

## 2017-12-17 ENCOUNTER — Other Ambulatory Visit: Payer: Self-pay | Admitting: Women's Health

## 2017-12-17 DIAGNOSIS — R928 Other abnormal and inconclusive findings on diagnostic imaging of breast: Secondary | ICD-10-CM

## 2017-12-19 ENCOUNTER — Other Ambulatory Visit: Payer: Self-pay | Admitting: Women's Health

## 2017-12-19 ENCOUNTER — Ambulatory Visit
Admission: RE | Admit: 2017-12-19 | Discharge: 2017-12-19 | Disposition: A | Discharge status: Home / Self Care | Payer: BC Managed Care – PPO | Source: Ambulatory Visit | Attending: Women's Health | Admitting: Women's Health

## 2017-12-19 DIAGNOSIS — N6310 Unspecified lump in the right breast, unspecified quadrant: Secondary | ICD-10-CM

## 2017-12-19 DIAGNOSIS — R928 Other abnormal and inconclusive findings on diagnostic imaging of breast: Secondary | ICD-10-CM

## 2017-12-24 ENCOUNTER — Ambulatory Visit
Admission: RE | Admit: 2017-12-24 | Discharge: 2017-12-24 | Disposition: A | Discharge status: Home / Self Care | Payer: BC Managed Care – PPO | Source: Ambulatory Visit | Attending: Women's Health | Admitting: Women's Health

## 2017-12-24 DIAGNOSIS — N6310 Unspecified lump in the right breast, unspecified quadrant: Secondary | ICD-10-CM

## 2018-03-20 ENCOUNTER — Ambulatory Visit: Payer: BC Managed Care – PPO | Admitting: Obstetrics & Gynecology

## 2018-03-20 ENCOUNTER — Encounter: Payer: Self-pay | Admitting: Obstetrics & Gynecology

## 2018-03-20 VITALS — BP 136/88

## 2018-03-20 DIAGNOSIS — N921 Excessive and frequent menstruation with irregular cycle: Secondary | ICD-10-CM

## 2018-03-20 DIAGNOSIS — Z113 Encounter for screening for infections with a predominantly sexual mode of transmission: Secondary | ICD-10-CM | POA: Diagnosis not present

## 2018-03-20 MED ORDER — LEVONORGESTREL-ETHINYL ESTRAD 0.1-20 MG-MCG PO TABS: 1.0000 | ORAL_TABLET | Freq: Every day | ORAL | 0 refills | Status: DC

## 2018-03-20 NOTE — Progress Notes (Signed)
    Miranda Knight February 19, 1976 [id number]        42 y.o.  G1P0010 Stable boyfriend  RP: BTB on BCPs  HPI: Continuous BCPs with withdrawal every 3 months for migraines.  Since December 2018, frequent BTB.  No pelvic pain. No pain with IC.  Normal vaginal secretions.  Urine/BMs wnl.   OB History  Gravida Para Term Preterm AB Living  1       1 0  SAB TAB Ectopic Multiple Live Births    1          # Outcome Date GA Lbr Len/2nd Weight Sex Delivery Anes PTL Lv  1 TAB             Past medical history,surgical history, problem list, medications, allergies, family history and social history were all reviewed and documented in the EPIC chart.   Directed ROS with pertinent positives and negatives documented in the history of present illness/assessment and plan.  Exam:  Vitals:   03/20/18 1036  BP: 136/88   General appearance:  Normal  Abdomen: Normal  Gynecologic exam: Vulva normal.  Speculum: Cervix normal.  No erythema, no polyp.  Vagina normal.  Normal vaginal secretions.  Gonorrhea and Chlamydia done.   Assessment/Plan:  42 y.o. G1P001  1. Breakthrough bleeding on birth control pills Breakthrough bleeding on birth control pills, probably due to continuous use with withdrawal every 3 months.  Will rule out an endometrial pathology at follow-up pelvic ultrasound.  Normal gynecologic exam today.  Decision not to change the birth control pill at this time, but will allow withdrawal bleeding every month. - US Transvaginal Non-OB; Future  2. Screen for STD (sexually transmitted disease) - C. trachomatis/N. gonorrhoeae RNA  Other orders - levonorgestrel-ethinyl estradiol (SRONYX) 0.1-20 MG-MCG tablet; Take 1 tablet by mouth daily.  Counseling on above issues and coordination of care more than 50% for 25 minutes.  Miranda DelMarie-Lyne Valora Norell MD, 10:54 AM 03/20/2018

## 2018-03-21 ENCOUNTER — Encounter: Payer: Self-pay | Admitting: Obstetrics & Gynecology

## 2018-03-21 LAB — C. TRACHOMATIS/N. GONORRHOEAE RNA
C. trachomatis RNA, TMA: NOT DETECTED
N. gonorrhoeae RNA, TMA: NOT DETECTED

## 2018-03-21 NOTE — Patient Instructions (Signed)
1. Breakthrough bleeding on birth control pills Breakthrough bleeding on birth control pills, probably due to continuous use with withdrawal every 3 months.  Will rule out an endometrial pathology at follow-up pelvic ultrasound.  Normal gynecologic exam today.  Decision not to change the birth control pill at this time, but will allow withdrawal bleeding every month. - US Transvaginal Non-OB; Future  2. Screen for STD (sexually transmitted disease) - C. trachomatis/N. gonorrhoeae RNA  Other orders - levonorgestrel-ethinyl estradiol (SRONYX) 0.1-20 MG-MCG tablet; Take 1 tablet by mouth daily.  Miranda Knight, it was a pleasure meeting you today!  I will inform you of your results as soon as they are available.

## 2018-04-23 ENCOUNTER — Ambulatory Visit: Payer: BC Managed Care – PPO | Admitting: Obstetrics & Gynecology

## 2018-04-23 ENCOUNTER — Ambulatory Visit (INDEPENDENT_AMBULATORY_CARE_PROVIDER_SITE_OTHER): Payer: BC Managed Care – PPO

## 2018-04-23 ENCOUNTER — Other Ambulatory Visit: Payer: Self-pay | Admitting: Obstetrics & Gynecology

## 2018-04-23 DIAGNOSIS — N83201 Unspecified ovarian cyst, right side: Secondary | ICD-10-CM

## 2018-04-23 DIAGNOSIS — N921 Excessive and frequent menstruation with irregular cycle: Secondary | ICD-10-CM | POA: Diagnosis not present

## 2018-04-23 NOTE — Progress Notes (Signed)
    Miranda Knight 08-07-1976 [id number]        42 y.o.  G1P0010 stable boyfriend.  RP: BTB on BCPs for Pelvic US  HPI: Resolved BTB on new BCP.  No pelvic pain.   OB History  Gravida Para Term Preterm AB Living  1       1 0  SAB TAB Ectopic Multiple Live Births    1          # Outcome Date GA Lbr Len/2nd Weight Sex Delivery Anes PTL Lv  1 TAB             Past medical history,surgical history, problem list, medications, allergies, family history and social history were all reviewed and documented in the EPIC chart.   Directed ROS with pertinent positives and negatives documented in the history of present illness/assessment and plan.  Exam:  There were no vitals filed for this visit. General appearance:  Normal  Pelvic US today: T/V images.  Retroverted uterus with homogeneous myometrium measuring 7.12 x 4.31 x 3.71 cm.  Endometrial lining thin and normal at 2.8 mm.  Right ovary with a thin walled cyst measuring 3.1 x 2.8 x 2.9 cm.  Thin septations within the cyst with negative color flow Doppler.  Arterial blood flow seen to the right ovary.  Left ovary normal.  No free fluid in the posterior cul-de-sac.   Assessment/Plan:  42 y.o. G1P0010   1. Right ovarian cyst Right ovarian cyst measuring 3.1 cm with benign appearance but thin septation.  Patient reassured.  Decision to follow-up with a pelvic ultrasound in 3 months to reassess. - US Transvaginal Non-OB; Future  2. Breakthrough bleeding on birth control pills Breakthrough bleeding on birth control pill resolved on the new pill.  Endometrial lining is thin and normal at 2.8 mm.  Patient reassured.  Will follow up shortly for annual gynecologic exam.  Counseling on above issues and coordination of care more than 50% for 15 minutes.  Genia Del MD, 3:37 PM 04/23/2018

## 2018-04-26 ENCOUNTER — Ambulatory Visit: Payer: BC Managed Care – PPO | Admitting: Obstetrics & Gynecology

## 2018-04-26 ENCOUNTER — Encounter: Payer: Self-pay | Admitting: Obstetrics & Gynecology

## 2018-04-26 VITALS — BP 146/92 | Ht 65.5 in | Wt 171.0 lb

## 2018-04-26 DIAGNOSIS — N83201 Unspecified ovarian cyst, right side: Secondary | ICD-10-CM | POA: Diagnosis not present

## 2018-04-26 DIAGNOSIS — R87610 Atypical squamous cells of undetermined significance on cytologic smear of cervix (ASC-US): Secondary | ICD-10-CM

## 2018-04-26 DIAGNOSIS — Z3041 Encounter for surveillance of contraceptive pills: Secondary | ICD-10-CM | POA: Diagnosis not present

## 2018-04-26 DIAGNOSIS — Z01419 Encounter for gynecological examination (general) (routine) without abnormal findings: Secondary | ICD-10-CM

## 2018-04-26 DIAGNOSIS — Z1151 Encounter for screening for human papillomavirus (HPV): Secondary | ICD-10-CM

## 2018-04-26 MED ORDER — LEVONORGESTREL-ETHINYL ESTRAD 0.1-20 MG-MCG PO TABS: 1.0000 | ORAL_TABLET | Freq: Every day | ORAL | 4 refills | Status: DC

## 2018-04-26 NOTE — Patient Instructions (Signed)
1. Right ovarian cyst Right ovarian cyst measuring 3.1 cm with benign appearance but thin septation.  Patient reassured.  Decision to follow-up with a pelvic ultrasound in 3 months to reassess. - US Transvaginal Non-OB; Future  2. Breakthrough bleeding on birth control pills Breakthrough bleeding on birth control pill resolved on the new pill.  Endometrial lining is thin and normal at 2.8 mm.  Patient reassured.  Will follow up shortly for annual gynecologic exam.  Alcario Drought, good seeing you today!

## 2018-04-26 NOTE — Patient Instructions (Signed)
  1. Encounter for routine gynecological examination with Papanicolaou smear of cervix Normal gynecologic exam.  Had ASCUS with negative high-risk HPV in May 2018.  Pap test with high risk HPV done today.  Breast exam normal.  Recommend increased physical activity with aerobic activities 5 times a week and weightlifting every 2 days.  Low calorie/carb nutrition recommended.  2. Encounter for surveillance of contraceptive pills Well on birth control pill and no contraindication.  Levonorgestrel ethinyl estradiol 1/20 represcribed.  3. ASCUS of cervix with negative high risk HPV Pap with high-risk HPV done today.  4. Right ovarian cyst Currently as symptomatic benign-appearing 3.1 cm right ovarian cyst.  Given thin septations, decision to repeat a pelvic ultrasound in 3 months.  Other orders - levonorgestrel-ethinyl estradiol (SRONYX) 0.1-20 MG-MCG tablet; Take 1 tablet by mouth daily.  Alcario Drought, good seeing you today!  I will inform you of your results as soon as they are available.

## 2018-04-26 NOTE — Progress Notes (Signed)
Miranda Knight 03-22-1976 [id number]   History:    42 y.o. G1P0A1 Stable boyfriend  RP:  Established patient presenting for annual gyn exam   HPI: Well on levonorgestrel ethinyl estradiol 1/20.  No breakthrough bleeding.  No current pelvic pain.  Pelvic ultrasound done May 21st 2019 showed a 3.1 cm right ovarian cyst of benign appearance but with thin septations.  The plan is to repeat a pelvic ultrasound in 3 months.  No pain with intercourse.  Normal vaginal secretions.  Gonorrhea and Chlamydia negative in April 2019.  Had ASCUS with negative high risk HPV in May 2018.  Breasts normal.  Urine and bowel movements normal.  Planning to increase physical activity.  Has a good nutrition.  Body mass index 28.02.  Past medical history,surgical history, family history and social history were all reviewed and documented in the EPIC chart.  Gynecologic History Patient's last menstrual period was 04/14/2018. Contraception: OCP (estrogen/progesterone) Last Pap: 04/2017. Results were: ASCUS/HPV HR negative Last mammogram: Never Bone Density: Never Colonoscopy: Never  Obstetric History OB History  Gravida Para Term Preterm AB Living  1       1 0  SAB TAB Ectopic Multiple Live Births    1          # Outcome Date GA Lbr Len/2nd Weight Sex Delivery Anes PTL Lv  1 TAB              ROS: A ROS was performed and pertinent positives and negatives are included in the history.  GENERAL: No fevers or chills. HEENT: No change in vision, no earache, sore throat or sinus congestion. NECK: No pain or stiffness. CARDIOVASCULAR: No chest pain or pressure. No palpitations. PULMONARY: No shortness of breath, cough or wheeze. GASTROINTESTINAL: No abdominal pain, nausea, vomiting or diarrhea, melena or bright red blood per rectum. GENITOURINARY: No urinary frequency, urgency, hesitancy or dysuria. MUSCULOSKELETAL: No joint or muscle pain, no back pain, no recent trauma. DERMATOLOGIC: No rash, no itching, no  lesions. ENDOCRINE: No polyuria, polydipsia, no heat or cold intolerance. No recent change in weight. HEMATOLOGICAL: No anemia or easy bruising or bleeding. NEUROLOGIC: No headache, seizures, numbness, tingling or weakness. PSYCHIATRIC: No depression, no loss of interest in normal activity or change in sleep pattern.     Exam:   BP (!) 146/92   Ht 5' 5.5" (1.664 m)   Wt 171 lb (77.6 kg)   LMP 04/14/2018 Comment: pill   BMI 28.02 kg/m   Body mass index is 28.02 kg/m.  General appearance : Well developed well nourished female. No acute distress HEENT: Eyes: no retinal hemorrhage or exudates,  Neck supple, trachea midline, no carotid bruits, no thyroidmegaly Lungs: Clear to auscultation, no rhonchi or wheezes, or rib retractions  Heart: Regular rate and rhythm, no murmurs or gallops Breast:Examined in sitting and supine position were symmetrical in appearance, no palpable masses or tenderness,  no skin retraction, no nipple inversion, no nipple discharge, no skin discoloration, no axillary or supraclavicular lymphadenopathy Abdomen: no palpable masses or tenderness, no rebound or guarding Extremities: no edema or skin discoloration or tenderness  Pelvic: Vulva: Normal             Vagina: No gross lesions or discharge  Cervix: No gross lesions or discharge.  Pap/HR HPV done.  Uterus  RV, normal size, shape and consistency, non-tender and mobile  Adnexa  Without masses or tenderness  Anus: Normal   Assessment/Plan:  42 y.o. female for annual exam  1. Encounter for routine gynecological examination with Papanicolaou smear of cervix Normal gynecologic exam.  Had ASCUS with negative high-risk HPV in May 2018.  Pap test with high risk HPV done today.  Breast exam normal.  Recommend increased physical activity with aerobic activities 5 times a week and weightlifting every 2 days.  Low calorie/carb nutrition recommended.  2. Encounter for surveillance of contraceptive pills Well on birth  control pill and no contraindication.  Levonorgestrel ethinyl estradiol 1/20 represcribed.  3. ASCUS of cervix with negative high risk HPV Pap with high-risk HPV done today.  4. Right ovarian cyst Currently as symptomatic benign-appearing 3.1 cm right ovarian cyst.  Given thin septations, decision to repeat a pelvic ultrasound in 3 months.  Other orders - levonorgestrel-ethinyl estradiol (SRONYX) 0.1-20 MG-MCG tablet; Take 1 tablet by mouth daily.  Genia Del MD, 8:27 AM 04/26/2018

## 2018-04-30 LAB — PAP, TP IMAGING W/ HPV RNA, RFLX HPV TYPE 16,18/45: HPV DNA High Risk: NOT DETECTED

## 2018-07-18 ENCOUNTER — Other Ambulatory Visit: Payer: BC Managed Care – PPO

## 2018-07-18 ENCOUNTER — Ambulatory Visit: Payer: BC Managed Care – PPO | Admitting: Obstetrics & Gynecology

## 2018-07-31 ENCOUNTER — Ambulatory Visit: Payer: BC Managed Care – PPO | Admitting: Obstetrics & Gynecology

## 2018-07-31 ENCOUNTER — Encounter: Payer: Self-pay | Admitting: Obstetrics & Gynecology

## 2018-07-31 ENCOUNTER — Ambulatory Visit (INDEPENDENT_AMBULATORY_CARE_PROVIDER_SITE_OTHER): Payer: BC Managed Care – PPO

## 2018-07-31 VITALS — BP 130/90

## 2018-07-31 DIAGNOSIS — N83201 Unspecified ovarian cyst, right side: Secondary | ICD-10-CM | POA: Diagnosis not present

## 2018-07-31 NOTE — Progress Notes (Signed)
    Miranda Knight Feb 03, 1976 [id number]        42 y.o.  G1P0010   RP: Rt ovarian cyst with septation for f/u Pelvic [place]  HPI: Well on birth control pills.  No abnormal bleeding.  No pelvic pain.  No pain with intercourse.   OB History  Gravida Para Term Preterm AB Living  1       1 0  SAB TAB Ectopic Multiple Live Births    1          # Outcome Date GA Lbr Len/2nd Weight Sex Delivery Anes PTL Lv  1 TAB             Past medical history,surgical history, problem list, medications, allergies, family history and social history were all reviewed and documented in the EPIC chart.   Directed ROS with pertinent positives and negatives documented in the history of present illness/assessment and plan.  Exam:  Vitals:   07/31/18 1240  BP: 130/90   General appearance:  Normal  Pelvic US today: T/V images.  Anteverted uterus deviated to the right adnexa measuring 8.03 x 4.39 cm, unchanged since last ultrasound exam.  Endometrial lining is thin at 4.0 mm.  Right ovary with continued presence of a thin-walled cyst measuring 3.0 x 3.2 x 2.7 cm.  Lateral wall thin septum still present.  Negative color flow Doppler to the cyst wall.  Left ovary normal.  No free fluid in the posterior cul-de-sac.   Assessment/Plan:  42 y.o. G1P0010   1. Right ovarian cyst Pelvic ultrasound findings reviewed with patient.  Stable, asymptomatic, benign appearing Rt Ovarian Cyst.  Patient reassured.    Follow-up annual gynecologic exam in May 2020.  Counseling on above issues and coordination of care more than 50% for 15 minutes.  [name] MD, 12:47 PM 07/31/2018

## 2018-07-31 NOTE — Patient Instructions (Signed)
  1. Right ovarian cyst Pelvic ultrasound findings reviewed with patient.  Stable, asymptomatic, benign appearing Rt Ovarian Cyst.  Patient reassured.    Follow-up annual gynecologic exam in May 2020.  Miranda DroughtErica, good seeing you today!

## 2018-08-15 ENCOUNTER — Ambulatory Visit: Payer: Self-pay | Admitting: Nurse Practitioner

## 2018-08-15 VITALS — BP 128/90 | HR 89 | Temp 98.7°F | Resp 18 | Wt 179.0 lb

## 2018-08-15 DIAGNOSIS — R0982 Postnasal drip: Secondary | ICD-10-CM

## 2018-08-15 DIAGNOSIS — J309 Allergic rhinitis, unspecified: Secondary | ICD-10-CM

## 2018-08-15 MED ORDER — MONTELUKAST SODIUM 10 MG PO TABS: 10.0000 mg | ORAL_TABLET | Freq: Every day | ORAL | 0 refills | Status: DC

## 2018-08-15 NOTE — Patient Instructions (Signed)
Allergic Rhinitis, Adult -Switch allergy medication to Singulair, take one tablet daily at bedtime. -Ibuprofen 800mg  every 8 hours for 3 days for throat pain and inflammation. -Warm or cool liquids for throat pain or discomfort. -Increase fluids. -Warm saltwater gargles 3-4 times daily until symptoms improve. -May use a teaspoon of honey for throat discomfort. -Follow up in 3-5 days if symptoms do not improve.  Allergic rhinitis is an allergic reaction that affects the mucous membrane inside the nose. It causes sneezing, a runny or stuffy nose, and the feeling of mucus going down the back of the throat (postnasal drip). Allergic rhinitis can be mild to severe. There are two types of allergic rhinitis:  Seasonal. This type is also called hay fever. It happens only during certain seasons.  Perennial. This type can happen at any time of the year.  What are the causes? This condition happens when the body's defense system (immune system) responds to certain harmless substances called allergens as though they were germs.  Seasonal allergic rhinitis is triggered by pollen, which can come from grasses, trees, and weeds. Perennial allergic rhinitis may be caused by:  House dust mites.  Pet dander.  Mold spores.  What are the signs or symptoms? Symptoms of this condition include:  Sneezing.  Runny or stuffy nose (nasal congestion).  Postnasal drip.  Itchy nose.  Tearing of the eyes.  Trouble sleeping.  Daytime sleepiness.  How is this diagnosed? This condition may be diagnosed based on:  Your medical history.  A physical exam.  Tests to check for related conditions, such as: ? Asthma. ? Pink eye. ? Ear infection. ? Upper respiratory infection.  Tests to find out which allergens trigger your symptoms. These may include skin or blood tests.  How is this treated? There is no cure for this condition, but treatment can help control symptoms. Treatment may  include:  Taking medicines that block allergy symptoms, such as antihistamines. Medicine may be given as a shot, nasal spray, or pill.  Avoiding the allergen.  Desensitization. This treatment involves getting ongoing shots until your body becomes less sensitive to the allergen. This treatment may be done if other treatments do not help.  If taking medicine and avoiding the allergen does not work, new, stronger medicines may be prescribed.  Follow these instructions at home:  Find out what you are allergic to. Common allergens include smoke, dust, and pollen.  Avoid the things you are allergic to. These are some things you can do to help avoid allergens: ? Replace carpet with wood, tile, or vinyl flooring. Carpet can trap dander and dust. ? Do not smoke. Do not allow smoking in your home. ? Change your heating and air conditioning filter at least once a month. ? During allergy season:  Keep windows closed as much as possible.  Plan outdoor activities when pollen counts are lowest. This is usually during the evening hours.  When coming indoors, change clothing and shower before sitting on furniture or bedding.  Take over-the-counter and prescription medicines only as told by your health care provider.  Keep all follow-up visits as told by your health care provider. This is important. Contact a health care provider if:  You have a fever.  You develop a persistent cough.  You make whistling sounds when you breathe (you wheeze).  Your symptoms interfere with your normal daily activities. Get help right away if:  You have shortness of breath. Summary  This condition can be managed by taking medicines as  directed and avoiding allergens.  Contact your health care provider if you develop a persistent cough or fever.  During allergy season, keep windows closed as much as possible. This information is not intended to replace advice given to you by your health care provider. Make  sure you discuss any questions you have with your health care provider. Document Released: 08/15/2001 Document Revised: 12/28/2016 Document Reviewed: 12/28/2016 Elsevier Interactive Patient Education  2018 [employer]  Postnasal Drip Postnasal drip is the feeling of mucus going down the back of your throat. Mucus is a slimy substance that moistens and cleans your nose and throat, as well as the air pockets in face bones near your forehead and cheeks (sinuses). Small amounts of mucus pass from your nose and sinuses down the back of your throat all the time. This is normal. When you produce too much mucus or the mucus gets too thick, you can feel it. Some common causes of postnasal drip include:  Having more mucus because of: ? A cold or the flu. ? Allergies. ? Cold air. ? Certain medicines.  Having more mucus that is thicker because of: ? A sinus or nasal infection. ? Dry air. ? A food allergy.  Follow these instructions at home: Relieving discomfort  Gargle with a salt-water mixture 3-4 times a day or as needed. To make a salt-water mixture, completely dissolve -1 tsp of salt in 1 cup of warm water.  If the air in your home is dry, use a humidifier to add moisture to the air.  Use a saline spray or container (neti pot) to flush out the nose (nasal irrigation). These methods can help clear away mucus and keep the nasal passages moist. General instructions  Take over-the-counter and prescription medicines only as told by your health care provider.  Follow instructions from your health care provider about eating or drinking restrictions. You may need to avoid caffeine.  Avoid things that you know you are allergic to (allergens), like dust, mold, pollen, pets, or certain foods.  Drink enough fluid to keep your urine pale yellow.  Keep all follow-up visits as told by your health care provider. This is important. Contact a health care provider if:  You have a fever.  You have  a sore throat.  You have difficulty swallowing.  You have headache.  You have sinus pain.  You have a cough that does not go away.  The mucus from your nose becomes thick and is green or yellow in color.  You have cold or flu symptoms that last more than 10 days. Summary  Postnasal drip is the feeling of mucus going down the back of your throat.  If your health care provider approves, use nasal irrigation or a nasal spray 2?4 times a day.  Avoid things that you know you are allergic to (allergens), like dust, mold, pollen, pets, or certain foods. This information is not intended to replace advice given to you by your health care provider. Make sure you discuss any questions you have with your health care provider. Document Released: 03/05/2017 Document Revised: 03/05/2017 Document Reviewed: 03/05/2017 Elsevier Interactive Patient Education  [employer].

## 2018-08-15 NOTE — Progress Notes (Signed)
Subjective:     Miranda Knight is a 42 y.o. female who presents for evaluation of sore throat. Associated symptoms include nasal blockage, post nasal drip, sore throat and swollen glands. Onset of symptoms was 3 days ago, and have been gradually worsening since that time. She is drinking plenty of fluids. She has not had a recent close exposure to someone with proven streptococcal pharyngitis.  The patient states she does have a history of seasonal allergies.  Patient thought her symptoms were related to her allergies.  Patient patient has been taking Sudafed 12-hour with minimal relief.  Patient also has taken ibuprofen 400 mg for her throat pain, with little to no relief.  The following portions of the patient's history were reviewed and updated as appropriate: allergies, current medications and past medical history.  Review of Systems Constitutional: positive for fatigue, negative for anorexia, chills, fevers and malaise Eyes: negative Ears, nose, mouth, throat, and face: positive for nasal congestion and sore throat, negative for ear drainage, earaches and hoarseness Respiratory: positive for cough, negative for asthma, chronic bronchitis, hemoptysis, pneumonia, sputum, stridor and wheezing Cardiovascular: negative Gastrointestinal: negative Neurological: positive for headaches, negative for coordination problems, dizziness, seizures, speech problems, tremors, vertigo and weakness Allergic/Immunologic: positive for hay fever    Objective:    BP 128/90 (BP Location: Right Arm, Patient Position: Sitting, Cuff Size: Normal)   Pulse 89   Temp 98.7 F (37.1 C) (Oral)   Resp 18   Wt 179 lb (81.2 kg)   SpO2 98%   BMI 29.33 kg/m  General appearance: alert, cooperative and no distress Head: Normocephalic, without obvious abnormality, atraumatic Eyes: conjunctivae/corneas clear. PERRL, EOM's intact. Fundi benign. Ears: normal TM's and external ear canals both ears Nose: Nares normal. Septum  midline. Mucosa normal. No drainage or sinus tenderness. Throat: lips, mucosa, and tongue normal; teeth and gums normal Neck: no adenopathy, no carotid bruit, no JVD, supple, symmetrical, trachea midline and thyroid not enlarged, symmetric, no tenderness/mass/nodules Lungs: clear to auscultation bilaterally Heart: regular rate and rhythm, S1, S2 normal, no murmur, click, rub or gallop Abdomen: soft, non-tender; bowel sounds normal; no masses,  no organomegaly Pulses: 2+ and symmetric Skin: Skin color, texture, turgor normal. No rashes or lesions Lymph nodes: cervical adenopathy to right neck Neurologic: Grossly normal  Laboratory Strep test not done. Results:not applicable.    Assessment:   Allergic Rhinitis and Postnasal Drip    Plan:   Exam findings, diagnosis etiology and medication use and indications reviewed with patient. Follow- Up and discharge instructions provided. No emergent/urgent issues found on exam.  Patient education was provided.Patient verbalized understanding of information provided and agrees with plan of care (POC), all questions answered. The patient is advised to call or return to clinic if he does not see an improvement in symptoms, or to seek the care of the closest emergency department if he worsens with the above plan.   1. Allergic rhinitis with postnasal drip  - montelukast (SINGULAIR) 10 MG tablet; Take 1 tablet (10 mg total) by mouth at bedtime.  Dispense: 30 tablet; Refill: 0 -Switch allergy medication to Singulair, take one tablet daily at bedtime. -Ibuprofen 800mg  every 8 hours for 3 days for throat pain and inflammation. -Warm or cool liquids for throat pain or discomfort. -Increase fluids. -Warm saltwater gargles 3-4 times daily until symptoms improve. -May use a teaspoon of honey for throat discomfort. -Follow up in 3-5 days if symptoms do not improve.

## 2018-09-06 ENCOUNTER — Other Ambulatory Visit: Payer: Self-pay | Admitting: Nurse Practitioner

## 2018-09-06 DIAGNOSIS — R0982 Postnasal drip: Principal | ICD-10-CM

## 2018-09-06 DIAGNOSIS — J309 Allergic rhinitis, unspecified: Secondary | ICD-10-CM

## 2018-09-12 IMAGING — MG DIGITAL SCREENING BILATERAL MAMMOGRAM WITH CAD
5 series · 5 of 5 positions shown · non-contrast
Comparison: None.

CLINICAL DATA: Screening.

EXAM:
DIGITAL SCREENING BILATERAL MAMMOGRAM WITH CAD

[L CC]
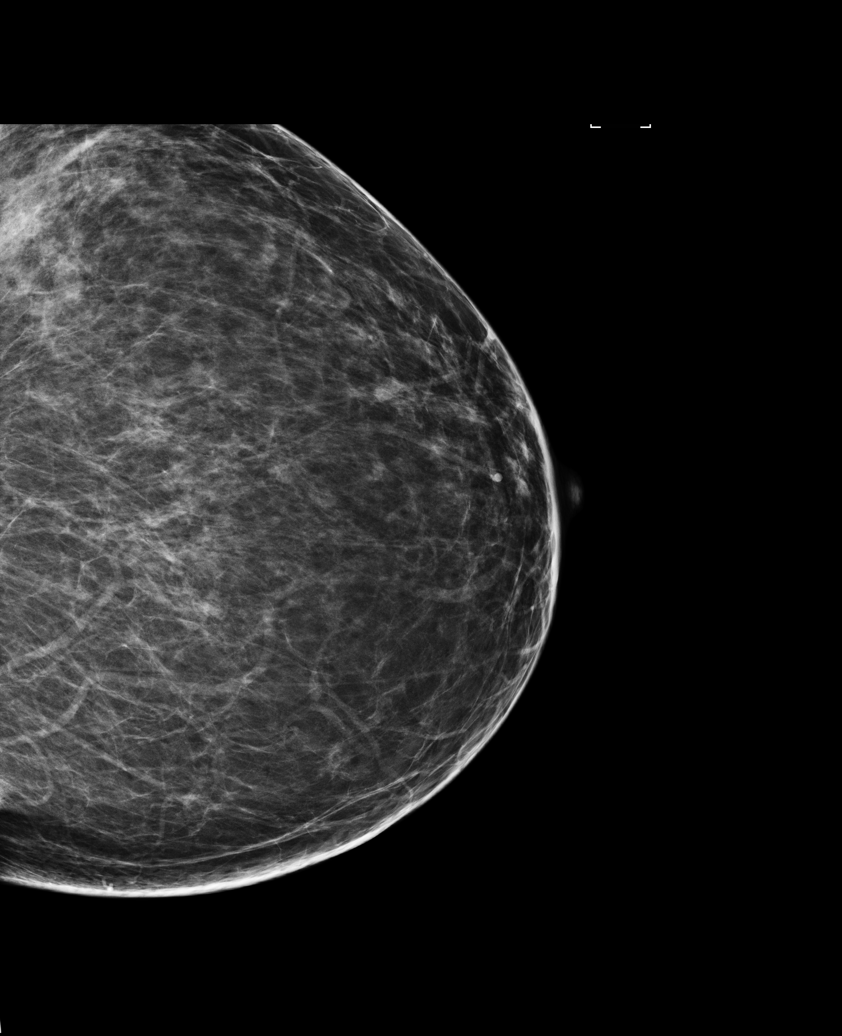

[R MLO]
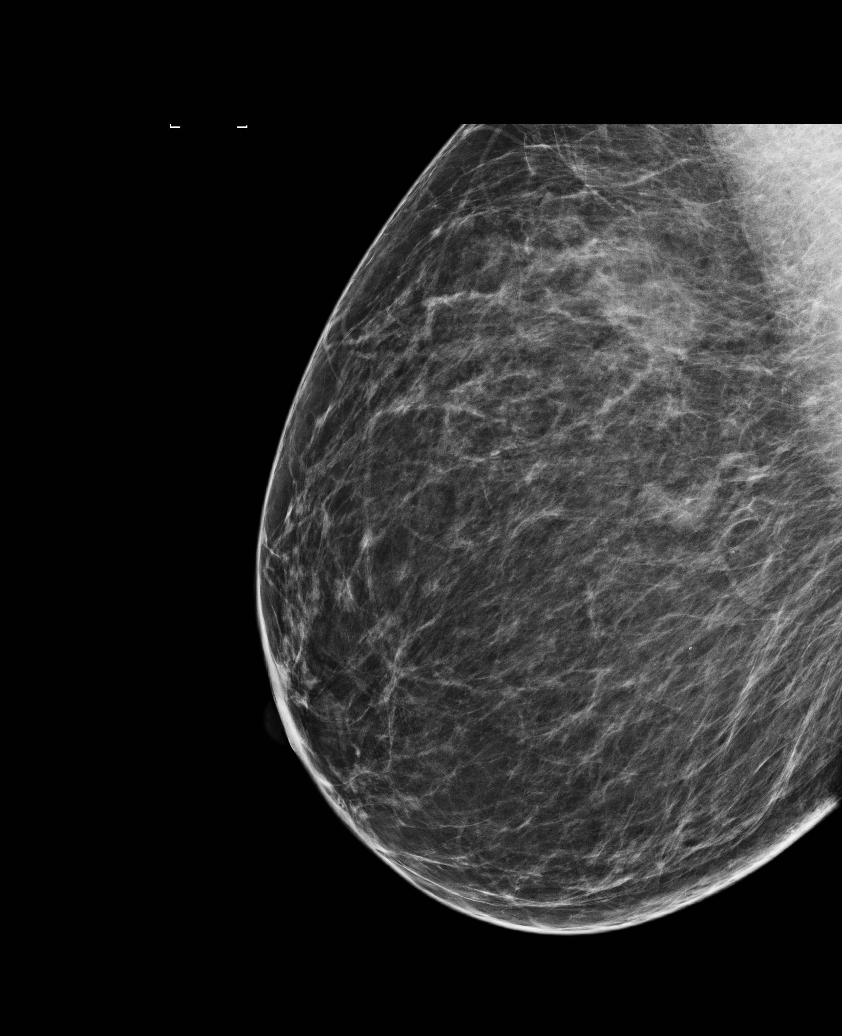

[R CC (1 of 2)]
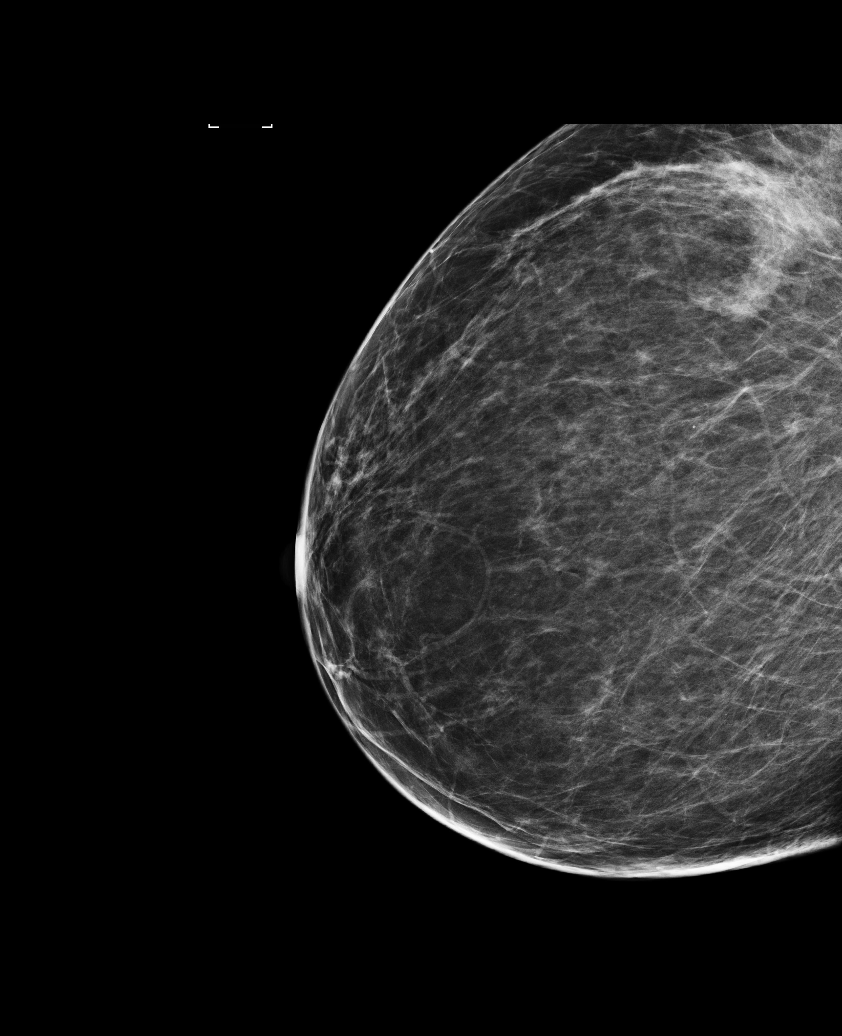

[R CC (2 of 2)]
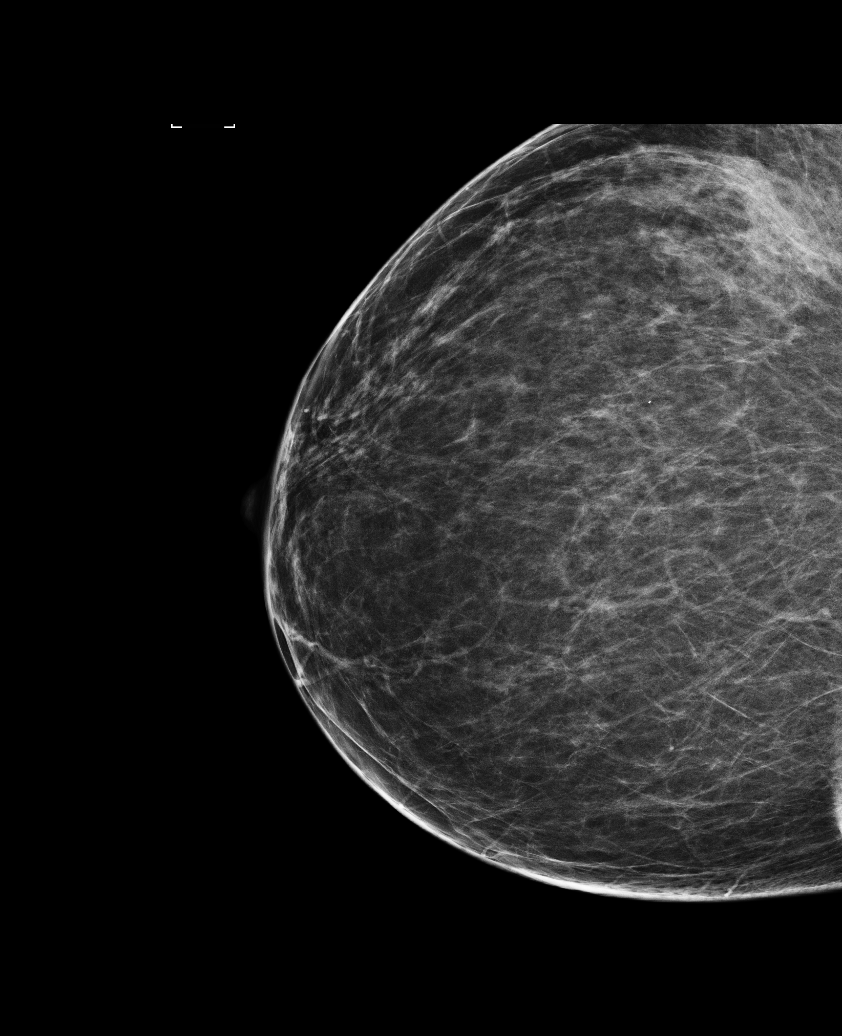

[L MLO]
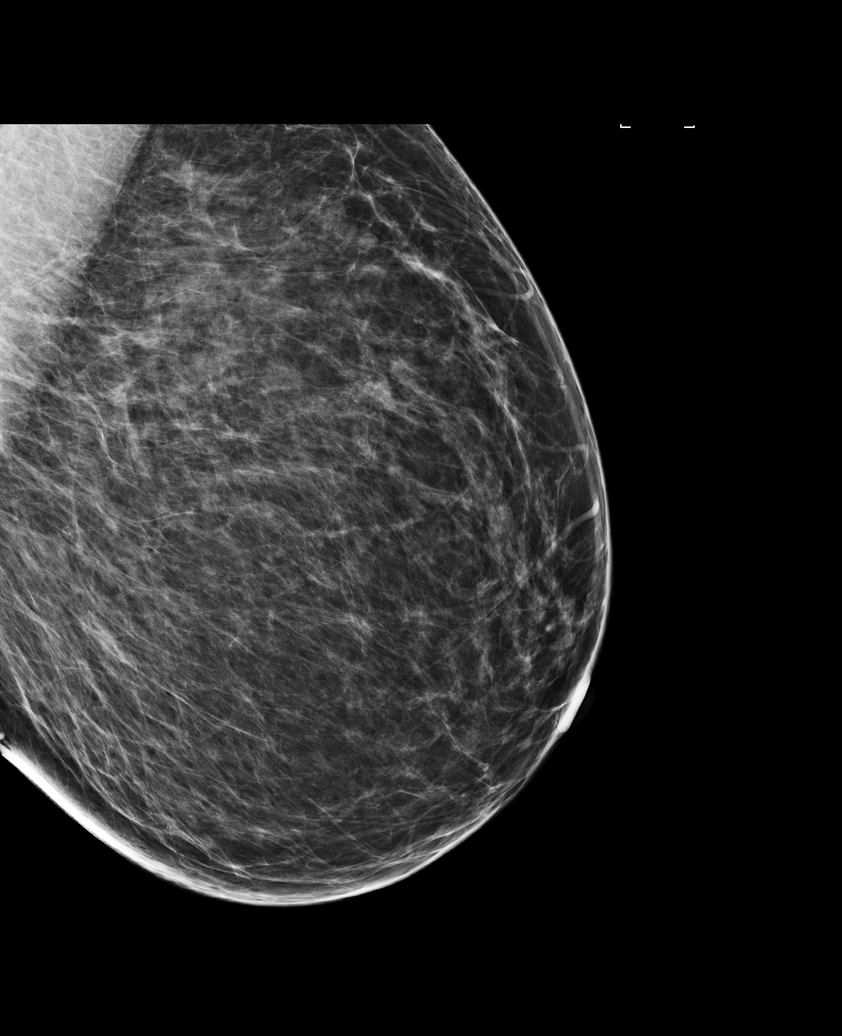

[5 of 5 positions shown; findings below may reference images not displayed]

ACR Breast Density Category b: There are scattered areas of
fibroglandular density.
FINDINGS: In the right breast, a possible asymmetry warrants further
evaluation. In the left breast, no findings suspicious for
malignancy. Images were processed with CAD.
IMPRESSION: Further evaluation is suggested for possible asymmetry in the right
breast.

RECOMMENDATION:
Diagnostic mammogram and possibly ultrasound of the right breast.
(Code:[id number])

The patient will be contacted regarding the findings, and additional
imaging will be scheduled.

BI-RADS CATEGORY  0: Incomplete. Need additional imaging evaluation
and/or prior mammograms for comparison.

## 2018-10-07 ENCOUNTER — Other Ambulatory Visit: Payer: Self-pay | Admitting: Gynecology

## 2018-10-07 ENCOUNTER — Other Ambulatory Visit: Payer: Self-pay | Admitting: Obstetrics & Gynecology

## 2018-12-03 ENCOUNTER — Other Ambulatory Visit: Payer: Self-pay | Admitting: Obstetrics & Gynecology

## 2018-12-03 DIAGNOSIS — Z1231 Encounter for screening mammogram for malignant neoplasm of breast: Secondary | ICD-10-CM

## 2019-01-01 ENCOUNTER — Ambulatory Visit
Admission: RE | Admit: 2019-01-01 | Discharge: 2019-01-01 | Disposition: A | Discharge status: Home / Self Care | Payer: BC Managed Care – PPO | Source: Ambulatory Visit | Attending: Obstetrics & Gynecology | Admitting: Obstetrics & Gynecology

## 2019-01-01 DIAGNOSIS — Z1231 Encounter for screening mammogram for malignant neoplasm of breast: Secondary | ICD-10-CM

## 2019-05-01 ENCOUNTER — Other Ambulatory Visit: Payer: Self-pay

## 2019-05-01 ENCOUNTER — Other Ambulatory Visit: Payer: Self-pay | Admitting: Podiatry

## 2019-05-01 ENCOUNTER — Ambulatory Visit (INDEPENDENT_AMBULATORY_CARE_PROVIDER_SITE_OTHER): Payer: BC Managed Care – PPO

## 2019-05-01 ENCOUNTER — Encounter: Payer: Self-pay | Admitting: Podiatry

## 2019-05-01 ENCOUNTER — Ambulatory Visit: Payer: BC Managed Care – PPO | Admitting: Podiatry

## 2019-05-01 VITALS — BP 138/92 | HR 83

## 2019-05-01 DIAGNOSIS — M79671 Pain in right foot: Secondary | ICD-10-CM

## 2019-05-01 DIAGNOSIS — M722 Plantar fascial fibromatosis: Secondary | ICD-10-CM

## 2019-05-01 DIAGNOSIS — M79672 Pain in left foot: Secondary | ICD-10-CM | POA: Diagnosis not present

## 2019-05-01 DIAGNOSIS — M21619 Bunion of unspecified foot: Secondary | ICD-10-CM | POA: Diagnosis not present

## 2019-05-01 MED ORDER — TRIAMCINOLONE ACETONIDE 10 MG/ML IJ SUSP
10.0000 mg | Freq: Once | INTRAMUSCULAR | Status: AC
  Administered 2019-05-01: 10 mg

## 2019-05-01 MED ORDER — MELOXICAM 15 MG PO TABS: 15.0000 mg | ORAL_TABLET | Freq: Every day | ORAL | 2 refills | Status: DC

## 2019-05-01 NOTE — Patient Instructions (Signed)
Plantar Fasciitis  Plantar fasciitis is a painful foot condition that affects the heel. It occurs when the band of tissue that connects the toes to the heel bone (plantar fascia) becomes irritated. This can happen as the result of exercising too much or doing other repetitive activities (overuse injury). The pain from plantar fasciitis can range from mild irritation to severe pain that makes it difficult to walk or move. The pain is usually worse in the morning after sleeping, or after sitting or lying down for a while. Pain may also be worse after long periods of walking or standing. What are the causes? This condition may be caused by:  Standing for long periods of time.  Wearing shoes that do not have good arch support.  Doing activities that put stress on joints (high-impact activities), including running, aerobics, and ballet.  Being overweight.  An abnormal way of walking (gait).  Tight muscles in the back of your lower leg (calf).  High arches in your feet.  Starting a new athletic activity. What are the signs or symptoms? The main symptom of this condition is heel pain. Pain may:  Be worse with first steps after a time of rest, especially in the morning after sleeping or after you have been sitting or lying down for a while.  Be worse after long periods of standing still.  Decrease after 30-45 minutes of activity, such as gentle walking. How is this diagnosed? This condition may be diagnosed based on your medical history and your symptoms. Your health care provider may ask questions about your activity level. Your health care provider will do a physical exam to check for:  A tender area on the bottom of your foot.  A high arch in your foot.  Pain when you move your foot.  Difficulty moving your foot. You may have imaging tests to confirm the diagnosis, such as:  X-rays.  Ultrasound.  MRI. How is this treated? Treatment for plantar fasciitis depends on how  severe your condition is. Treatment may include:  Rest, ice, applying pressure (compression), and raising the affected foot (elevation). This may be called RICE therapy. Your health care provider may recommend RICE therapy along with over-the-counter pain medicines to manage your pain.  Exercises to stretch your calves and your plantar fascia.  A splint that holds your foot in a stretched, upward position while you sleep (night splint).  Physical therapy to relieve symptoms and prevent problems in the future.  Injections of steroid medicine (cortisone) to relieve pain and inflammation.  Stimulating your plantar fascia with electrical impulses (extracorporeal shock wave therapy). This is usually the last treatment option before surgery.  Surgery, if other treatments have not worked after 12 months. Follow these instructions at home:  Managing pain, stiffness, and swelling  If directed, put ice on the painful area: ? Put ice in a plastic bag, or use a frozen bottle of water. ? Place a towel between your skin and the bag or bottle. ? Roll the bottom of your foot over the bag or bottle. ? Do this for 20 minutes, 2-3 times a day.  Wear athletic shoes that have air-sole or gel-sole cushions, or try wearing soft shoe inserts that are designed for plantar fasciitis.  Raise (elevate) your foot above the level of your heart while you are sitting or lying down. Activity  Avoid activities that cause pain. Ask your health care provider what activities are safe for you.  Do physical therapy exercises and stretches as told  by your health care provider.  Try activities and forms of exercise that are easier on your joints (low-impact). Examples include swimming, water aerobics, and biking. General instructions  Take over-the-counter and prescription medicines only as told by your health care provider.  Wear a night splint while sleeping, if told by your health care provider. Loosen the splint  if your toes tingle, become numb, or turn cold and blue.  Maintain a healthy weight, or work with your health care provider to lose weight as needed.  Keep all follow-up visits as told by your health care provider. This is important. Contact a health care provider if you:  Have symptoms that do not go away after caring for yourself at home.  Have pain that gets worse.  Have pain that affects your ability to move or do your daily activities. Summary  Plantar fasciitis is a painful foot condition that affects the heel. It occurs when the band of tissue that connects the toes to the heel bone (plantar fascia) becomes irritated.  The main symptom of this condition is heel pain that may be worse after exercising too much or standing still for a long time.  Treatment varies, but it usually starts with rest, ice, compression, and elevation (RICE therapy) and over-the-counter medicines to manage pain. This information is not intended to replace advice given to you by your health care provider. Make sure you discuss any questions you have with your health care provider. Document Released: 08/15/2001 Document Revised: 09/17/2017 Document Reviewed: 09/17/2017 Elsevier Interactive Patient Education  2019 [employer] Bunion  A bunion is a bump on the base of the big toe that forms when the bones of the big toe joint move out of position. Bunions may be small at first, but they often get larger over time. They can make walking painful. What are the causes? A bunion may be caused by:  Wearing narrow or pointed shoes that force the big toe to press against the other toes.  Abnormal foot development that causes the foot to roll inward (pronate).  Changes in the foot that are caused by certain diseases, such as rheumatoid arthritis or polio.  A foot injury. What increases the risk? The following factors may make you more likely to develop this condition:  Wearing shoes that squeeze the toes  together.  Having certain diseases, such as: ? Rheumatoid arthritis. ? Polio. ? Cerebral palsy.  Having family members who have bunions.  Being born with a foot deformity, such as flat feet or low arches.  Doing activities that put a lot of pressure on the feet, such as ballet dancing. What are the signs or symptoms? The main symptom of a bunion is a noticeable bump on the big toe. Other symptoms may include:  Pain.  Swelling around the big toe.  Redness and inflammation.  Thick or hardened skin on the big toe or between the toes.  Stiffness or loss of motion in the big toe.  Trouble with walking. How is this diagnosed? A bunion may be diagnosed based on your symptoms, medical history, and activities. You may have tests, such as:  X-rays. These allow your health care provider to check the position of the bones in your foot and look for damage to your joint. They also help your health care provider determine the severity of your bunion and the best way to treat it.  Joint aspiration. In this test, a sample of fluid is removed from the toe joint. This test may  be done if you are in a lot of pain. It helps rule out diseases that cause painful swelling of the joints, such as arthritis. How is this treated? Treatment depends on the severity of your symptoms. The goal of treatment is to relieve symptoms and prevent the bunion from getting worse. Your health care provider may recommend:  Wearing shoes that have a wide toe box.  Using bunion pads to cushion the affected area.  Taping your toes together to keep them in a normal position.  Placing a device inside your shoe (orthotics) to help reduce pressure on your toe joint.  Taking medicine to ease pain, inflammation, and swelling.  Applying heat or ice to the affected area.  Doing stretching exercises.  Surgery to remove scar tissue and move the toes back into their normal position. This treatment is rare. Follow these  instructions at home: Managing pain, stiffness, and swelling   If directed, put ice on the painful area: ? Put ice in a plastic bag. ? Place a towel between your skin and the bag. ? Leave the ice on for 20 minutes, 2-3 times a day. Activity   If directed, apply heat to the affected area before you exercise. Use the heat source that your health care provider recommends, such as a moist heat pack or a heating pad. ? Place a towel between your skin and the heat source. ? Leave the heat on for 20-30 minutes. ? Remove the heat if your skin turns bright red. This is especially important if you are unable to feel pain, heat, or cold. You may have a greater risk of getting burned.  Do exercises as told by your health care provider. General instructions  Support your toe joint with proper footwear, shoe padding, or taping as told by your health care provider.  Take over-the-counter and prescription medicines only as told by your health care provider.  Keep all follow-up visits as told by your health care provider. This is important. Contact a health care provider if your symptoms:  Get worse.  Do not improve in 2 weeks. Get help right away if you have:  Severe pain and trouble with walking. Summary  A bunion is a bump on the base of the big toe that forms when the bones of the big toe joint move out of position.  Bunions can make walking painful.  Treatment depends on the severity of your symptoms.  Support your toe joint with proper footwear, shoe padding, or taping as told by your health care provider. This information is not intended to replace advice given to you by your health care provider. Make sure you discuss any questions you have with your health care provider. Document Released: 11/20/2005 Document Revised: 04/02/2018 Document Reviewed: 04/02/2018 Elsevier Interactive Patient Education  2019 [employer].

## 2019-05-03 NOTE — Progress Notes (Signed)
Subjective:   Patient ID: Miranda Knight, female   DOB: 43 y.o.   MRN: [id number]   HPI Patient states she is developed a lot of pain in the bottom of both heels and it is been going on about 5 months and she has had this a number of years ago and is gotten worse again recently especially when she gets up in the morning after sitting.  Patient does not currently smoke and likes to be active   Review of Systems  All other systems reviewed and are negative.       Objective:  Physical Exam Vitals signs and nursing note reviewed.  Constitutional:      Appearance: She is well-developed.  Pulmonary:     Effort: Pulmonary effort is normal.  Musculoskeletal: Normal range of motion.  Skin:    General: Skin is warm.  Neurological:     Mental Status: She is alert.     Neurovascular status found to be intact muscle strength is adequate range of motion within normal limits with patient found to have exquisite discomfort plantar aspect heel region bilateral with inflammation fluid at the medial band at the insertion calcaneus.  Patient is noted to have good digital perfusion well oriented x3     Assessment:  Acute plantar fasciitis bilateral with inflammation fluid of the medial band     Plan:  H&P condition reviewed and went ahead today and did sterile prep and injected the fascia bilateral 3 mg Kenalog 5 with M Xylocaine applied fascial braces bilateral instructed on supportive shoes physical therapy and reviewed x-rays  X-rays indicate that there is small spur no indication stress fracture arthritis or other active process

## 2019-05-07 ENCOUNTER — Other Ambulatory Visit: Payer: Self-pay

## 2019-05-08 ENCOUNTER — Encounter: Payer: Self-pay | Admitting: Obstetrics & Gynecology

## 2019-05-08 ENCOUNTER — Ambulatory Visit (INDEPENDENT_AMBULATORY_CARE_PROVIDER_SITE_OTHER): Payer: BC Managed Care – PPO | Admitting: Obstetrics & Gynecology

## 2019-05-08 VITALS — BP 140/80 | Ht 65.0 in | Wt 170.0 lb

## 2019-05-08 DIAGNOSIS — Z01419 Encounter for gynecological examination (general) (routine) without abnormal findings: Secondary | ICD-10-CM

## 2019-05-08 DIAGNOSIS — Z3041 Encounter for surveillance of contraceptive pills: Secondary | ICD-10-CM | POA: Diagnosis not present

## 2019-05-08 DIAGNOSIS — Z113 Encounter for screening for infections with a predominantly sexual mode of transmission: Secondary | ICD-10-CM

## 2019-05-08 DIAGNOSIS — Z1151 Encounter for screening for human papillomavirus (HPV): Secondary | ICD-10-CM

## 2019-05-08 DIAGNOSIS — R87610 Atypical squamous cells of undetermined significance on cytologic smear of cervix (ASC-US): Secondary | ICD-10-CM

## 2019-05-08 MED ORDER — LEVONORGESTREL-ETHINYL ESTRAD 0.1-20 MG-MCG PO TABS: 1.0000 | ORAL_TABLET | Freq: Every day | ORAL | 4 refills | Status: DC

## 2019-05-08 NOTE — Progress Notes (Signed)
Miranda Knight Mar 20, 1976 [id number]   History:    43 y.o. G1P0A1 Stable boyfriend  RP:  Established patient presenting for annual gyn exam   HPI: Well on levonorgestrel ethinyl estradiol 0.1/20 BCPs.  No breakthrough bleeding.  No pelvic pain.  No pain with intercourse.  Pelvic ultrasound in August 2019 showed a small stable right ovarian cyst measured at 3.2 cm.  Normal vaginal secretions.  Would like STI screening.  Urine and bowel movements normal.  Breasts normal.  Body mass index 28.29.  Health labs with family physician.  Past medical history,surgical history, family history and social history were all reviewed and documented in the EPIC chart.  Gynecologic History Patient's last menstrual period was 03/02/2019. Contraception: OCP (estrogen/progesterone) Last Pap: 04/2018. Results were: Negative/HPV HR neg.  ASCUS 04/2017. Last mammogram: 12/2018. Results were: Negative Bone Density: Never Colonoscopy: Never  Obstetric History OB History  Gravida Para Term Preterm AB Living  1       1 0  SAB TAB Ectopic Multiple Live Births    1          # Outcome Date GA Lbr Len/2nd Weight Sex Delivery Anes PTL Lv  1 TAB              ROS: A ROS was performed and pertinent positives and negatives are included in the history.  GENERAL: No fevers or chills. HEENT: No change in vision, no earache, sore throat or sinus congestion. NECK: No pain or stiffness. CARDIOVASCULAR: No chest pain or pressure. No palpitations. PULMONARY: No shortness of breath, cough or wheeze. GASTROINTESTINAL: No abdominal pain, nausea, vomiting or diarrhea, melena or bright red blood per rectum. GENITOURINARY: No urinary frequency, urgency, hesitancy or dysuria. MUSCULOSKELETAL: No joint or muscle pain, no back pain, no recent trauma. DERMATOLOGIC: No rash, no itching, no lesions. ENDOCRINE: No polyuria, polydipsia, no heat or cold intolerance. No recent change in weight. HEMATOLOGICAL: No anemia or easy bruising or  bleeding. NEUROLOGIC: No headache, seizures, numbness, tingling or weakness. PSYCHIATRIC: No depression, no loss of interest in normal activity or change in sleep pattern.     Exam:   BP (!) 160/100 (BP Location: Right Arm, Patient Position: Sitting, Cuff Size: Normal)   Ht 5\' 5"  (1.651 m)   Wt 170 lb (77.1 kg)   LMP 03/02/2019   BMI 28.29 kg/m   Body mass index is 28.29 kg/m.  General appearance : Well developed well nourished female. No acute distress HEENT: Eyes: no retinal hemorrhage or exudates,  Neck supple, trachea midline, no carotid bruits, no thyroidmegaly Lungs: Clear to auscultation, no rhonchi or wheezes, or rib retractions  Heart: Regular rate and rhythm, no murmurs or gallops Breast:Examined in sitting and supine position were symmetrical in appearance, no palpable masses or tenderness,  no skin retraction, no nipple inversion, no nipple discharge, no skin discoloration, no axillary or supraclavicular lymphadenopathy Abdomen: no palpable masses or tenderness, no rebound or guarding Extremities: no edema or skin discoloration or tenderness  Pelvic: Vulva: Normal             Vagina: No gross lesions or discharge  Cervix: No gross lesions or discharge.  Pap/HPV HR, Gono-Chlam done.  Uterus  AV, normal size, shape and consistency, non-tender and mobile  Adnexa  Without masses or tenderness  Anus: Normal    Assessment/Plan:  43 y.o. female for annual exam   1. Encounter for routine gynecological examination with Papanicolaou smear of cervix Normal gynecologic exam.  Pap test  with high-risk HPV done, history of ASCUS in 2018.  Breast exam normal.  Screening mammogram January 2020 was negative.  Health labs with family physician.  Body mass index 28.29.  Encouraged a slightly lower calorie/carb diet such as [employer] diet and continue physical activity with aerobic activities 5 times a week and weightlifting every 2 days.  2. ASCUS of cervix with negative high risk HPV   3. Encounter for surveillance of contraceptive pills Well on birth control pill with no contraindication.  Levonorgestrel ethinyl estradiol 0.1/20 represcribed.  4. Screen for STD (sexually transmitted disease) - Gonorrhea and chlamydia on Pap test - HIV antibody (with reflex) - RPR - Hepatitis C Antibody - Hepatitis B Surface AntiGEN  Other orders - meloxicam (MOBIC) 15 MG tablet; Take 15 mg by mouth daily. - levonorgestrel-ethinyl estradiol (SRONYX) 0.1-20 MG-MCG tablet; Take 1 tablet by mouth daily.  Genia DelMarie-Lyne Norwin Aleman MD, 8:53 AM 05/08/2019

## 2019-05-09 LAB — RPR: RPR Ser Ql: NONREACTIVE

## 2019-05-09 LAB — HEPATITIS B SURFACE ANTIGEN: Hepatitis B Surface Ag: NONREACTIVE

## 2019-05-09 LAB — HEPATITIS C ANTIBODY
Hepatitis C Ab: NONREACTIVE
SIGNAL TO CUT-OFF: 0.02 (ref <1.00)

## 2019-05-09 LAB — HIV ANTIBODY (ROUTINE TESTING W REFLEX): HIV 1&2 Ab, 4th Generation: NONREACTIVE

## 2019-05-10 ENCOUNTER — Encounter: Payer: Self-pay | Admitting: Obstetrics & Gynecology

## 2019-05-10 NOTE — Patient Instructions (Signed)
1. Encounter for routine gynecological examination with Papanicolaou smear of cervix Normal gynecologic exam.  Pap test with high-risk HPV done, history of ASCUS in 2018.  Breast exam normal.  Screening mammogram January 2020 was negative.  Health labs with family physician.  Body mass index 28.29.  Encouraged a slightly lower calorie/carb diet such as [employer] and continue physical activity with aerobic activities 5 times a week and weightlifting every 2 days.  2. ASCUS of cervix with negative high risk HPV  3. Encounter for surveillance of contraceptive pills Well on birth control pill with no contraindication.  Levonorgestrel ethinyl estradiol 0.1/20 represcribed.  4. Screen for STD (sexually transmitted disease) - Gonorrhea and chlamydia on Pap test - HIV antibody (with reflex) - RPR - Hepatitis C Antibody - Hepatitis B Surface AntiGEN  Other orders - meloxicam (MOBIC) 15 MG tablet; Take 15 mg by mouth daily. - levonorgestrel-ethinyl estradiol (SRONYX) 0.1-20 MG-MCG tablet; Take 1 tablet by mouth daily.  Miranda Knight, it was a pleasure seeing you today!  I will inform you of your results as soon as they are available.

## 2019-05-12 LAB — PAP IG, CT-NG NAA, HPV HIGH-RISK
C. trachomatis RNA, TMA: NOT DETECTED
HPV DNA High Risk: NOT DETECTED
N. gonorrhoeae RNA, TMA: NOT DETECTED

## 2019-05-22 ENCOUNTER — Encounter: Payer: Self-pay | Admitting: Podiatry

## 2019-05-22 ENCOUNTER — Ambulatory Visit: Payer: BC Managed Care – PPO | Admitting: Podiatry

## 2019-05-22 ENCOUNTER — Other Ambulatory Visit: Payer: Self-pay

## 2019-05-22 VITALS — Temp 98.0°F

## 2019-05-22 DIAGNOSIS — M722 Plantar fascial fibromatosis: Secondary | ICD-10-CM

## 2019-05-22 NOTE — Progress Notes (Signed)
Subjective:   Patient ID: Miranda Knight, female   DOB: 43 y.o.   MRN: [id number]   HPI Patient states my heels feel a lot better but I had this problem for a long time and I want to get active and I have not walked since I had the procedure done   ROS      Objective:  Physical Exam  Neurovascular status intact muscle strength is adequate range of motion within normal limits.  Patient's heels have improved and are still tender but somewhat improved from previous with moderate depression of the arch noted bilateral     Assessment:  Plantar fasciitis bilateral improving but still present     Plan:  HEP condition reviewed and recommended orthotics and work with ped orthotist for orthotics due to longstanding nature of the problem and her activity levels.  Encouraged her to call us with questions concerns

## 2019-06-05 ENCOUNTER — Ambulatory Visit: Payer: BC Managed Care – PPO | Admitting: Orthotics

## 2019-06-05 ENCOUNTER — Other Ambulatory Visit: Payer: Self-pay

## 2019-06-05 DIAGNOSIS — M6788 Other specified disorders of synovium and tendon, other site: Secondary | ICD-10-CM

## 2019-06-05 DIAGNOSIS — M722 Plantar fascial fibromatosis: Secondary | ICD-10-CM

## 2019-06-05 NOTE — Progress Notes (Signed)
Patient came in today to p/up functional foot orthotics.   The orthotics were assessed to both fit and function.  The F/O addressed the biomechanical issues/pathologies as intended, offering good longitudinal arch support, proper offloading, and foot support. There weren't any signs of discomfort or irritation.  The F/O fit properly in footwear with minimal trimming/adjustments. 

## 2019-08-26 ENCOUNTER — Encounter: Payer: Self-pay | Admitting: Gynecology

## 2020-01-02 ENCOUNTER — Other Ambulatory Visit: Payer: Self-pay | Admitting: Internal Medicine

## 2020-01-02 DIAGNOSIS — Z1231 Encounter for screening mammogram for malignant neoplasm of breast: Secondary | ICD-10-CM

## 2020-01-06 ENCOUNTER — Other Ambulatory Visit: Payer: Self-pay

## 2020-01-06 ENCOUNTER — Ambulatory Visit
Admission: RE | Admit: 2020-01-06 | Discharge: 2020-01-06 | Disposition: A | Discharge status: Home / Self Care | Payer: BC Managed Care – PPO | Source: Ambulatory Visit

## 2020-01-06 DIAGNOSIS — Z1231 Encounter for screening mammogram for malignant neoplasm of breast: Secondary | ICD-10-CM

## 2020-05-10 ENCOUNTER — Encounter: Payer: BC Managed Care – PPO | Admitting: Obstetrics & Gynecology

## 2020-05-11 ENCOUNTER — Other Ambulatory Visit: Payer: Self-pay

## 2020-05-12 ENCOUNTER — Ambulatory Visit: Payer: BC Managed Care – PPO | Admitting: Obstetrics & Gynecology

## 2020-05-12 ENCOUNTER — Encounter: Payer: Self-pay | Admitting: Obstetrics & Gynecology

## 2020-05-12 VITALS — BP 122/76 | Ht 63.5 in | Wt 166.0 lb

## 2020-05-12 DIAGNOSIS — Z1151 Encounter for screening for human papillomavirus (HPV): Secondary | ICD-10-CM

## 2020-05-12 DIAGNOSIS — Z01419 Encounter for gynecological examination (general) (routine) without abnormal findings: Secondary | ICD-10-CM | POA: Diagnosis not present

## 2020-05-12 DIAGNOSIS — Z113 Encounter for screening for infections with a predominantly sexual mode of transmission: Secondary | ICD-10-CM

## 2020-05-12 DIAGNOSIS — Z3041 Encounter for surveillance of contraceptive pills: Secondary | ICD-10-CM | POA: Diagnosis not present

## 2020-05-12 MED ORDER — LEVONORGESTREL-ETHINYL ESTRAD 0.1-20 MG-MCG PO TABS: 1.0000 | ORAL_TABLET | Freq: Every day | ORAL | 4 refills | Status: DC

## 2020-05-12 NOTE — Progress Notes (Signed)
Miranda Knight 17-Jan-1976 [id number]   History:    44 y.o. G1P0A1 Stable boyfriend  RP:  Established patient presenting for annual gyn exam   HPI: Well on levonorgestrel ethinyl estradiol 0.1/20 BCPs.  No breakthrough bleeding.  No pelvic pain.  No pain with intercourse.  Normal vaginal secretions.  Would like STI screening.  Urine and bowel movements normal.  Breasts normal.  Body mass index 28.94.  Health labs with family physician.   Past medical history,surgical history, family history and social history were all reviewed and documented in the EPIC chart.  Gynecologic History Patient's last menstrual period was 05/07/2020.  Obstetric History OB History  Gravida Para Term Preterm AB Living  1       1 0  SAB TAB Ectopic Multiple Live Births    1          # Outcome Date GA Lbr Len/2nd Weight Sex Delivery Anes PTL Lv  1 TAB              ROS: A ROS was performed and pertinent positives and negatives are included in the history.  GENERAL: No fevers or chills. HEENT: No change in vision, no earache, sore throat or sinus congestion. NECK: No pain or stiffness. CARDIOVASCULAR: No chest pain or pressure. No palpitations. PULMONARY: No shortness of breath, cough or wheeze. GASTROINTESTINAL: No abdominal pain, nausea, vomiting or diarrhea, melena or bright red blood per rectum. GENITOURINARY: No urinary frequency, urgency, hesitancy or dysuria. MUSCULOSKELETAL: No joint or muscle pain, no back pain, no recent trauma. DERMATOLOGIC: No rash, no itching, no lesions. ENDOCRINE: No polyuria, polydipsia, no heat or cold intolerance. No recent change in weight. HEMATOLOGICAL: No anemia or easy bruising or bleeding. NEUROLOGIC: No headache, seizures, numbness, tingling or weakness. PSYCHIATRIC: No depression, no loss of interest in normal activity or change in sleep pattern.     Exam:   BP 122/76   Ht 5' 3.5" (1.613 m)   Wt 166 lb (75.3 kg)   LMP 05/07/2020 Comment: PILL  BMI 28.94 kg/m    Body mass index is 28.94 kg/m.  General appearance : Well developed well nourished female. No acute distress HEENT: Eyes: no retinal hemorrhage or exudates,  Neck supple, trachea midline, no carotid bruits, no thyroidmegaly Lungs: Clear to auscultation, no rhonchi or wheezes, or rib retractions  Heart: Regular rate and rhythm, no murmurs or gallops Breast:Examined in sitting and supine position were symmetrical in appearance, no palpable masses or tenderness,  no skin retraction, no nipple inversion, no nipple discharge, no skin discoloration, no axillary or supraclavicular lymphadenopathy Abdomen: no palpable masses or tenderness, no rebound or guarding Extremities: no edema or skin discoloration or tenderness  Pelvic: Vulva: Normal             Vagina: No gross lesions or discharge  Cervix: No gross lesions or discharge.  Pap/HPV HR, Gono-Chlam done.  Uterus  AV, normal size, shape and consistency, non-tender and mobile  Adnexa  Without masses or tenderness  Anus: Normal   Assessment/Plan:  44 y.o. female for annual exam   1. Encounter for routine gynecological examination with Papanicolaou smear of cervix Normal gynecologic exam.  Pap test with high-risk HPV done today.  Breast exam normal.  Screening mammogram February 2021 was negative.  Health labs with family physician.  Body mass index 28.94.  Recommend a slightly lower calorie/carb diet such as [employer].  Aerobic activities 5 times a week and light weightlifting every 2 days.  2. Encounter for surveillance of contraceptive pills Well on birth control pill.  No contraindication to continue.  Prescription sent to pharmacy.  3. Screen for STD (sexually transmitted disease) - Gono-Chlam on Pap - HIV antibody (with reflex) - RPR - Hepatitis C Antibody - Hepatitis B Surface AntiGEN  Other orders - magnesium 30 MG tablet; Take 30 mg by mouth 2 (two) times daily. - levonorgestrel-ethinyl estradiol (SRONYX) 0.1-20  MG-MCG tablet; Take 1 tablet by mouth daily.  Princess Bruins MD, 12:21 PM 05/12/2020

## 2020-05-13 ENCOUNTER — Encounter: Payer: Self-pay | Admitting: Obstetrics & Gynecology

## 2020-05-13 LAB — HEPATITIS C ANTIBODY
Hepatitis C Ab: NONREACTIVE
SIGNAL TO CUT-OFF: 0.01 (ref <1.00)

## 2020-05-13 LAB — HIV ANTIBODY (ROUTINE TESTING W REFLEX): HIV 1&2 Ab, 4th Generation: NONREACTIVE

## 2020-05-13 LAB — RPR: RPR Ser Ql: NONREACTIVE

## 2020-05-13 LAB — HEPATITIS B SURFACE ANTIGEN: Hepatitis B Surface Ag: NONREACTIVE

## 2020-05-13 NOTE — Patient Instructions (Signed)
1. Encounter for routine gynecological examination with Papanicolaou smear of cervix Normal gynecologic exam.  Pap test with high-risk HPV done today.  Breast exam normal.  Screening mammogram February 2021 was negative.  Health labs with family physician.  Body mass index 28.94.  Recommend a slightly lower calorie/carb diet such as [employer].  Aerobic activities 5 times a week and light weightlifting every 2 days.  2. Encounter for surveillance of contraceptive pills Well on birth control pill.  No contraindication to continue.  Prescription sent to pharmacy.  3. Screen for STD (sexually transmitted disease) - Gono-Chlam on Pap - HIV antibody (with reflex) - RPR - Hepatitis C Antibody - Hepatitis B Surface AntiGEN  Other orders - magnesium 30 MG tablet; Take 30 mg by mouth 2 (two) times daily. - levonorgestrel-ethinyl estradiol (SRONYX) 0.1-20 MG-MCG tablet; Take 1 tablet by mouth daily.  Miranda Knight, it was a pleasure seeing you today!  I will inform you of your results as soon as they are available.

## 2020-05-14 LAB — PAP IG, CT-NG NAA, HPV HIGH-RISK
C. trachomatis RNA, TMA: NOT DETECTED
HPV DNA High Risk: NOT DETECTED
N. gonorrhoeae RNA, TMA: NOT DETECTED

## 2020-08-09 ENCOUNTER — Telehealth: Payer: BC Managed Care – PPO | Admitting: Nurse Practitioner

## 2020-08-09 DIAGNOSIS — R05 Cough: Secondary | ICD-10-CM

## 2020-08-09 DIAGNOSIS — R059 Cough, unspecified: Secondary | ICD-10-CM

## 2020-08-09 MED ORDER — BENZONATATE 100 MG PO CAPS: 100.0000 mg | ORAL_CAPSULE | Freq: Three times a day (TID) | ORAL | 0 refills | Status: DC | PRN

## 2020-08-09 MED ORDER — AZITHROMYCIN 250 MG PO TABS: ORAL_TABLET | ORAL | 0 refills | Status: DC

## 2020-08-09 NOTE — Progress Notes (Signed)
We are sorry that you are not feeling well.  Here is how we plan to help!  Based on your presentation I believe you most likely have A cough due to bacteria.  When patients have a fever and a productive cough with a change in color or increased sputum production, we are concerned about bacterial bronchitis.  If left untreated it can progress to pneumonia.  If your symptoms do not improve with your treatment plan it is important that you contact your provider.   I have prescribed Azithromyin 250 mg: two tablets now and then one tablet daily for 4 additonal days    In addition you may use A prescription cough medication called Tessalon Perles 100mg. You may take 1-2 capsules every 8 hours as needed for your cough.   From your responses in the eVisit questionnaire you describe inflammation in the upper respiratory tract which is causing a significant cough.  This is commonly called Bronchitis and has four common causes:    Allergies  Viral Infections  Acid Reflux  Bacterial Infection Allergies, viruses and acid reflux are treated by controlling symptoms or eliminating the cause. An example might be a cough caused by taking certain blood pressure medications. You stop the cough by changing the medication. Another example might be a cough caused by acid reflux. Controlling the reflux helps control the cough.  USE OF BRONCHODILATOR ("RESCUE") INHALERS: There is a risk from using your bronchodilator too frequently.  The risk is that over-reliance on a medication which only relaxes the muscles surrounding the breathing tubes can reduce the effectiveness of medications prescribed to reduce swelling and congestion of the tubes themselves.  Although you feel brief relief from the bronchodilator inhaler, your asthma may actually be worsening with the tubes becoming more swollen and filled with mucus.  This can delay other crucial treatments, such as oral steroid medications. If you need to use a  bronchodilator inhaler daily, several times per day, you should discuss this with your provider.  There are probably better treatments that could be used to keep your asthma under control.     HOME CARE . Only take medications as instructed by your medical team. . Complete the entire course of an antibiotic. . Drink plenty of fluids and get plenty of rest. . Avoid close contacts especially the very young and the elderly . Cover your mouth if you cough or cough into your sleeve. . Always remember to wash your hands . A steam or ultrasonic humidifier can help congestion.   GET HELP RIGHT AWAY IF: . You develop worsening fever. . You become short of breath . You cough up blood. . Your symptoms persist after you have completed your treatment plan MAKE SURE YOU   Understand these instructions.  Will watch your condition.  Will get help right away if you are not doing well or get worse.  Your e-visit answers were reviewed by a board certified advanced clinical practitioner to complete your personal care plan.  Depending on the condition, your plan could have included both over the counter or prescription medications. If there is a problem please reply  once you have received a response from your provider. Your safety is important to us.  If you have drug allergies check your prescription carefully.    You can use MyChart to ask questions about today's visit, request a non-urgent call back, or ask for a work or school excuse for 24 hours related to this e-Visit. If it has been   greater than 24 hours you will need to follow up with your provider, or enter a new e-Visit to address those concerns. You will get an e-mail in the next two days asking about your experience.  I hope that your e-visit has been valuable and will speed your recovery. Thank you for using e-visits.  5-10 minutes spent reviewing and documenting in chart.  

## 2020-12-28 ENCOUNTER — Other Ambulatory Visit: Payer: Self-pay | Admitting: Internal Medicine

## 2020-12-28 DIAGNOSIS — Z1231 Encounter for screening mammogram for malignant neoplasm of breast: Secondary | ICD-10-CM

## 2021-01-19 ENCOUNTER — Other Ambulatory Visit: Payer: Self-pay

## 2021-01-19 ENCOUNTER — Ambulatory Visit
Admission: RE | Admit: 2021-01-19 | Discharge: 2021-01-19 | Disposition: A | Discharge status: Home / Self Care | Payer: BC Managed Care – PPO | Source: Ambulatory Visit | Attending: Internal Medicine | Admitting: Internal Medicine

## 2021-01-19 DIAGNOSIS — Z1231 Encounter for screening mammogram for malignant neoplasm of breast: Secondary | ICD-10-CM

## 2021-05-05 ENCOUNTER — Telehealth: Payer: BC Managed Care – PPO | Admitting: Physician Assistant

## 2021-05-05 DIAGNOSIS — J069 Acute upper respiratory infection, unspecified: Secondary | ICD-10-CM

## 2021-05-05 MED ORDER — BENZONATATE 100 MG PO CAPS: 100.0000 mg | ORAL_CAPSULE | Freq: Three times a day (TID) | ORAL | 0 refills | Status: DC | PRN

## 2021-05-05 MED ORDER — FLUTICASONE PROPIONATE 50 MCG/ACT NA SUSP: 2.0000 | Freq: Every day | NASAL | 0 refills | Status: DC

## 2021-05-05 NOTE — Progress Notes (Signed)
I have spent 5 minutes in review of e-visit questionnaire, review and updating patient chart, medical decision making and response to patient.   Dimetri Armitage Cody Anneliese Leblond, PA-C    

## 2021-05-05 NOTE — Progress Notes (Signed)
We are sorry you are not feeling well.  Here is how we plan to help!  Based on what you have shared with me, it looks like you may have a viral upper respiratory infection.  Upper respiratory infections are caused by a large number of viruses; however, rhinovirus is the most common cause.   Symptoms vary from person to person, with common symptoms including sore throat, cough, fatigue or lack of energy and feeling of general discomfort.  A low-grade fever of up to 100.4 may present.  Symptoms vary however, and are closely related to a person's age or underlying illnesses.  The most common symptoms associated with an upper respiratory infection are nasal discharge or congestion, cough, sneezing, headache and pressure in the ears and face.  These symptoms usually persist for about 3 to 10 days, but can last up to 2 weeks.  It is important to know that upper respiratory infections do not cause serious illness or complications in most cases.    Upper respiratory infections can be transmitted from person to person, with the most common method of transmission being a person's hands.  The virus is able to live on the skin and can infect other persons for up to 2 hours after direct contact.  Also, these can be transmitted when someone coughs or sneezes; thus, it is important to cover the mouth to reduce this risk.  To keep the spread of the illness at bay, good hand hygiene is very important.  This is an infection that is most likely caused by a virus. There are no specific treatments other than to help you with the symptoms until the infection runs its course.  We are sorry you are not feeling well.  Here is how we plan to help!  Giving body aches along with other symptoms, I do think it is reasonable for you to get COVID tested just to make sure. You can have this done at local [facility] or [facility] or you can go to [email or web address] to schedule a test with our system. You should quarantine until results are  in.   For nasal congestion, you may use an oral decongestants such as Mucinex D or if you have glaucoma or high blood pressure use plain Mucinex.  Saline nasal spray or nasal drops can help and can safely be used as often as needed for congestion.  For your congestion, I have prescribed Fluticasone nasal spray one spray in each nostril twice a day  If you do not have a history of heart disease, hypertension, diabetes or thyroid disease, prostate/bladder issues or glaucoma, you may also use Sudafed to treat nasal congestion.  It is highly recommended that you consult with a pharmacist or your primary care physician to ensure this medication is safe for you to take.     If you have a cough, you may use cough suppressants such as Delsym and Robitussin.  If you have glaucoma or high blood pressure, you can also use Coricidin HBP.   For cough I have prescribed for you A prescription cough medication called Tessalon Perles 100 mg. You may take 1-2 capsules every 8 hours as needed for cough  If you have a sore or scratchy throat, use a saltwater gargle-  to  teaspoon of salt dissolved in a 4-ounce to 8-ounce glass of warm water.  Gargle the solution for approximately 15-30 seconds and then spit.  It is important not to swallow the solution.  You can also use throat lozenges/cough drops  and Chloraseptic spray to help with throat pain or discomfort.  Warm or cold liquids can also be helpful in relieving throat pain.  For headache, pain or general discomfort, you can use Ibuprofen or Tylenol as directed.   Some authorities believe that zinc sprays or the use of Echinacea may shorten the course of your symptoms.   HOME CARE . Only take medications as instructed by your medical team. . Be sure to drink plenty of fluids. Water is fine as well as fruit juices, sodas and electrolyte beverages. You may want to stay away from caffeine or alcohol. If you are nauseated, try taking small sips of liquids. How do you  know if you are getting enough fluid? Your urine should be a pale yellow or almost colorless. . Get rest. . Taking a steamy shower or using a humidifier may help nasal congestion and ease sore throat pain. You can place a towel over your head and breathe in the steam from hot water coming from a faucet. . Using a saline nasal spray works much the same way. . Cough drops, hard candies and sore throat lozenges may ease your cough. . Avoid close contacts especially the very young and the elderly . Cover your mouth if you cough or sneeze . Always remember to wash your hands.   GET HELP RIGHT AWAY IF: . You develop worsening fever. . If your symptoms do not improve within 10 days . You develop yellow or green discharge from your nose over 3 days. . You have coughing fits . You develop a severe head ache or visual changes. . You develop shortness of breath, difficulty breathing or start having chest pain . Your symptoms persist after you have completed your treatment plan  MAKE SURE YOU   Understand these instructions.  Will watch your condition.  Will get help right away if you are not doing well or get worse.  Your e-visit answers were reviewed by a board certified advanced clinical practitioner to complete your personal care plan. Depending upon the condition, your plan could have included both over the counter or prescription medications. Please review your pharmacy choice. If there is a problem, you may call our nursing hot line at and have the prescription routed to another pharmacy. Your safety is important to [place]. If you have drug allergies check your prescription carefully.   You can use MyChart to ask questions about today's visit, request a non-urgent call back, or ask for a work or school excuse for 24 hours related to this e-Visit. If it has been greater than 24 hours you will need to follow up with your provider, or enter a new e-Visit to address those concerns. You will get an  e-mail in the next two days asking about your experience.  I hope that your e-visit has been valuable and will speed your recovery. Thank you for using e-visits.

## 2021-05-13 ENCOUNTER — Other Ambulatory Visit (HOSPITAL_COMMUNITY)
Admission: RE | Admit: 2021-05-13 | Discharge: 2021-05-13 | Disposition: A | Discharge status: Home / Self Care | Payer: BC Managed Care – PPO | Source: Ambulatory Visit | Attending: Obstetrics & Gynecology | Admitting: Obstetrics & Gynecology

## 2021-05-13 ENCOUNTER — Encounter: Payer: Self-pay | Admitting: Obstetrics & Gynecology

## 2021-05-13 ENCOUNTER — Ambulatory Visit (INDEPENDENT_AMBULATORY_CARE_PROVIDER_SITE_OTHER): Payer: BC Managed Care – PPO | Admitting: Obstetrics & Gynecology

## 2021-05-13 ENCOUNTER — Other Ambulatory Visit: Payer: Self-pay

## 2021-05-13 VITALS — BP 128/88 | Ht 64.0 in | Wt 163.0 lb

## 2021-05-13 DIAGNOSIS — Z01419 Encounter for gynecological examination (general) (routine) without abnormal findings: Secondary | ICD-10-CM | POA: Insufficient documentation

## 2021-05-13 DIAGNOSIS — Z3041 Encounter for surveillance of contraceptive pills: Secondary | ICD-10-CM | POA: Diagnosis not present

## 2021-05-13 DIAGNOSIS — Z113 Encounter for screening for infections with a predominantly sexual mode of transmission: Secondary | ICD-10-CM | POA: Diagnosis present

## 2021-05-13 MED ORDER — LEVONORGESTREL-ETHINYL ESTRAD 0.1-20 MG-MCG PO TABS: 1.0000 | ORAL_TABLET | Freq: Every day | ORAL | 4 refills | Status: DC

## 2021-05-13 NOTE — Progress Notes (Signed)
Miranda Knight [birth year] [id number]   History:    45 y.o. Stable boyfriend   RP:  Established patient presenting for annual gyn exam   HPI: Well on levonorgestrel ethinyl estradiol 0.1/20 BCPs.  No breakthrough bleeding.  No pelvic pain.  No pain with intercourse.  Normal vaginal secretions.  Would like STI screening.  Urine and bowel movements normal.  Breasts normal.  Body mass index 27.98.  Health labs with family physician.    Past medical history,surgical history, family history and social history were all reviewed and documented in the EPIC chart.  Gynecologic History Patient's last menstrual period was 05/05/2021.  Obstetric History OB History  Gravida Para Term Preterm AB Living  1       1 0  SAB IAB Ectopic Multiple Live Births    1          # Outcome Date GA Lbr Len/2nd Weight Sex Delivery Anes PTL Lv  1 IAB              ROS: A ROS was performed and pertinent positives and negatives are included in the history.  GENERAL: No fevers or chills. HEENT: No change in vision, no earache, sore throat or sinus congestion. NECK: No pain or stiffness. CARDIOVASCULAR: No chest pain or pressure. No palpitations. PULMONARY: No shortness of breath, cough or wheeze. GASTROINTESTINAL: No abdominal pain, nausea, vomiting or diarrhea, melena or bright red blood per rectum. GENITOURINARY: No urinary frequency, urgency, hesitancy or dysuria. MUSCULOSKELETAL: No joint or muscle pain, no back pain, no recent trauma. DERMATOLOGIC: No rash, no itching, no lesions. ENDOCRINE: No polyuria, polydipsia, no heat or cold intolerance. No recent change in weight. HEMATOLOGICAL: No anemia or easy bruising or bleeding. NEUROLOGIC: No headache, seizures, numbness, tingling or weakness. PSYCHIATRIC: No depression, no loss of interest in normal activity or change in sleep pattern.     Exam:   BP 128/88 (BP Location: Right Arm, Patient Position: Sitting, Cuff Size: Normal)   Ht 5\' 4"  (1.626 m)   Wt 163  lb (73.9 kg)   LMP 05/05/2021 Comment: Pill  BMI 27.98 kg/m   Body mass index is 27.98 kg/m.  General appearance : Well developed well nourished female. No acute distress HEENT: Eyes: no retinal hemorrhage or exudates,  Neck supple, trachea midline, no carotid bruits, no thyroidmegaly Lungs: Clear to auscultation, no rhonchi or wheezes, or rib retractions  Heart: Regular rate and rhythm, no murmurs or gallops Breast:Examined in sitting and supine position were symmetrical in appearance, no palpable masses or tenderness,  no skin retraction, no nipple inversion, no nipple discharge, no skin discoloration, no axillary or supraclavicular lymphadenopathy Abdomen: no palpable masses or tenderness, no rebound or guarding Extremities: no edema or skin discoloration or tenderness  Pelvic: Vulva: Normal             Vagina: No gross lesions or discharge  Cervix: No gross lesions or discharge.  Pap/HPV HR, Gono-Chlam done.  Uterus  RV, normal size, shape and consistency, non-tender and mobile  Adnexa  Without masses or tenderness  Anus: Normal   Assessment/Plan:  45 y.o. female for annual exam   1. Encounter for routine gynecological examination with Papanicolaou smear of cervix Normal gynecologic exam.  Pap test with high-risk HPV done.  Breast exam normal.  Screening mammogram February 2022 was negative.  Health labs with family physician.  Body mass index improved at 27.98.  Continue with fitness and healthy nutrition. - Cytology - PAP( [place])  2. Encounter for surveillance of contraceptive pills Well on Levonorgestrel-ethinyl estradiol 0.1/20.  No CI to continue.  Prescription sent to pharmacy.  3. Screen for STD (sexually transmitted disease) - Cytology - PAP( [place]) with HPV HR, Gono-Chlam - HIV antibody (with reflex) - RPR - Hepatitis B Surface AntiGEN - Hepatitis C Antibody  Other orders - levonorgestrel-ethinyl estradiol (SRONYX) 0.1-20 MG-MCG tablet; Take 1 tablet  by mouth daily.   Genia Del MD, 8:15 AM 05/13/2021

## 2021-05-16 LAB — CYTOLOGY - PAP
Chlamydia: NEGATIVE
Comment: NEGATIVE
Comment: NEGATIVE
Comment: NORMAL
Diagnosis: NEGATIVE
High risk HPV: NEGATIVE
Neisseria Gonorrhea: NEGATIVE

## 2021-05-16 LAB — HIV ANTIBODY (ROUTINE TESTING W REFLEX): HIV 1&2 Ab, 4th Generation: NONREACTIVE

## 2021-05-16 LAB — HEPATITIS C ANTIBODY
Hepatitis C Ab: NONREACTIVE
SIGNAL TO CUT-OFF: 0.01 (ref <1.00)

## 2021-05-16 LAB — HEPATITIS B SURFACE ANTIGEN: Hepatitis B Surface Ag: NONREACTIVE

## 2021-05-16 LAB — RPR: RPR Ser Ql: NONREACTIVE

## 2022-01-12 ENCOUNTER — Other Ambulatory Visit: Payer: Self-pay | Admitting: Internal Medicine

## 2022-01-12 DIAGNOSIS — Z1231 Encounter for screening mammogram for malignant neoplasm of breast: Secondary | ICD-10-CM

## 2022-01-25 ENCOUNTER — Ambulatory Visit
Admission: RE | Admit: 2022-01-25 | Discharge: 2022-01-25 | Disposition: A | Discharge status: Home / Self Care | Payer: BC Managed Care – PPO | Source: Ambulatory Visit

## 2022-01-25 DIAGNOSIS — Z1231 Encounter for screening mammogram for malignant neoplasm of breast: Secondary | ICD-10-CM

## 2022-03-02 ENCOUNTER — Ambulatory Visit (INDEPENDENT_AMBULATORY_CARE_PROVIDER_SITE_OTHER): Payer: BC Managed Care – PPO

## 2022-03-02 ENCOUNTER — Ambulatory Visit: Payer: BC Managed Care – PPO | Admitting: Podiatry

## 2022-03-02 ENCOUNTER — Encounter: Payer: Self-pay | Admitting: Podiatry

## 2022-03-02 DIAGNOSIS — M21619 Bunion of unspecified foot: Secondary | ICD-10-CM

## 2022-03-02 DIAGNOSIS — M21611 Bunion of right foot: Secondary | ICD-10-CM | POA: Diagnosis not present

## 2022-03-02 DIAGNOSIS — M21612 Bunion of left foot: Secondary | ICD-10-CM | POA: Diagnosis not present

## 2022-03-02 NOTE — Progress Notes (Signed)
Subjective:  ? ?Patient ID: Miranda Knight, female   DOB: 46 y.o.   MRN: [id number]  ? ?HPI ?Patient presents stating that her right bunion has really started to bother her and she wants to get it corrected like her left one was done a number of years ago.  States it sore when she tries to wear shoe gear and its been ongoing and also states her heel has been doing better.  Patient's tried wider shoes she has tried soaks and other modalities without relief and does not smoke likes to be active ? ? ?ROS ? ? ?   ?Objective:  ?Physical Exam  ?Neurovascular status found to be intact muscle strength adequate range of motion adequate and no first MPJ restriction.  Prominence of the first metatarsal head right with redness and irritation around the head and pain when pressed with the left showing excellent correction good alignment noted.  Good digital perfusion well oriented x3 ? ?   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 ?   ?Plan:  ?H&P x-rays reviewed and condition discussed at great length.  She would like to get this done in the near future needs to check work and since she has had the other 1 done is comfortable with doing her consent form today.  I allowed her to read a consent form I reviewed with her all complications as outlined in the consent form and the fact there is no long-term guarantee and that total recovery can take approximately 6 months.  I dispensed air fracture walker that I want her to get used to prior to procedure and all questions were answered ? ?X-rays indicate that there is elevation of the intermetatarsal angle right of approximate 15 degrees well corrected left with fixation in place joint congruence ?   ? ? ?

## 2022-03-06 ENCOUNTER — Telehealth: Payer: Self-pay | Admitting: Urology

## 2022-03-06 NOTE — Telephone Encounter (Signed)
DOS - 03/28/22 ? ?[facility] BUNIONECTOM[facility] RIGHT --- [phone] ? ?[employer] EFFECTIVE DATE - 12/04/21 ? ?PLAN DEDUCTIBLE - $500.00 W/ $500.00 REMAINING ?OUT OF POCKET - $2,500.00 W/ $2,445.00 REMAINING ?COINSURANCE - 10% ?COPAY - $0.00 ? ? ?RECEIVED FAX FROM [employer] [place] STATING THAT FOR CPT CODE 60454 NO PRIOR AUTH IS REQUIRED ?

## 2022-03-27 MED ORDER — ONDANSETRON HCL 4 MG PO TABS: 4.0000 mg | ORAL_TABLET | Freq: Three times a day (TID) | ORAL | 0 refills | Status: DC | PRN

## 2022-03-27 MED ORDER — OXYCODONE-ACETAMINOPHEN 10-325 MG PO TABS: 1.0000 | ORAL_TABLET | ORAL | 0 refills | Status: DC | PRN

## 2022-03-27 NOTE — Addendum Note (Signed)
Addended by: Lenn Sink on: 03/27/2022 01:42 PM ? ? Modules accepted: Orders ? ?

## 2022-03-28 ENCOUNTER — Encounter: Payer: Self-pay | Admitting: Podiatry

## 2022-03-28 DIAGNOSIS — M2011 Hallux valgus (acquired), right foot: Secondary | ICD-10-CM | POA: Diagnosis not present

## 2022-04-03 ENCOUNTER — Encounter: Payer: BC Managed Care – PPO | Admitting: Podiatry

## 2022-04-05 ENCOUNTER — Ambulatory Visit (INDEPENDENT_AMBULATORY_CARE_PROVIDER_SITE_OTHER): Payer: BC Managed Care – PPO | Admitting: Podiatry

## 2022-04-05 ENCOUNTER — Encounter: Payer: Self-pay | Admitting: Podiatry

## 2022-04-05 ENCOUNTER — Ambulatory Visit (INDEPENDENT_AMBULATORY_CARE_PROVIDER_SITE_OTHER): Payer: BC Managed Care – PPO

## 2022-04-05 DIAGNOSIS — Z9889 Other specified postprocedural states: Secondary | ICD-10-CM | POA: Diagnosis not present

## 2022-04-06 NOTE — Progress Notes (Signed)
Subjective:  ? ?Patient ID: Miranda Knight, female   DOB: 46 y.o.   MRN: [id number]  ? ?HPI ?Patient states doing very well with surgery very pleased so far ? ? ?ROS ? ? ?   ?Objective:  ?Physical Exam  ?Neurovascular status intact negative Bevelyn Buckles' sign noted first MPJ healing well wound edges well coapted hallux in rectus position minimal range of motion issues with no crepitus ? ?   ?Assessment:  ?Doing well post osteotomy right first metatarsal with good alignment ? ?   ?Plan:  ?H&P x-rays reviewed and at this point I want her to continue elevation compression immobilization and dispensed surgical shoe.  Patient will be seen back to recheck encouraged to call questions concerns ? ?X-rays indicate osteotomy is healing well and fixation is good joint congruence ?   ? ? ?

## 2022-05-03 ENCOUNTER — Encounter: Payer: Self-pay | Admitting: Podiatry

## 2022-05-05 ENCOUNTER — Ambulatory Visit (INDEPENDENT_AMBULATORY_CARE_PROVIDER_SITE_OTHER): Payer: BC Managed Care – PPO | Admitting: Podiatry

## 2022-05-05 ENCOUNTER — Ambulatory Visit (INDEPENDENT_AMBULATORY_CARE_PROVIDER_SITE_OTHER): Payer: BC Managed Care – PPO

## 2022-05-05 ENCOUNTER — Encounter: Payer: Self-pay | Admitting: Podiatry

## 2022-05-05 DIAGNOSIS — Z9889 Other specified postprocedural states: Secondary | ICD-10-CM | POA: Diagnosis not present

## 2022-05-07 NOTE — Progress Notes (Signed)
Subjective:   Patient ID: Miranda Knight, female   DOB: 46 y.o.   MRN: [id number]   HPI Patient states she is doing very well and very happy with her surgery neuro   ROS      Objective:  Physical Exam  Vascular status intact negative Bevelyn Buckles' sign noted wound edges right healed well wound edges well coapted good alignment good range of motion no crepitus      Assessment:  Doing well post osteotomy first metatarsal right foot     Plan:  H&P reviewed condition and at this point I am discharging after looking at x-ray I advised on activities that she can do a gradual increase over the next 4 to 6 weeks with return to normal activity  X-rays indicate fixation in place joint congruence everything looks healthy

## 2022-05-18 ENCOUNTER — Encounter: Payer: Self-pay | Admitting: Obstetrics & Gynecology

## 2022-05-18 ENCOUNTER — Ambulatory Visit (INDEPENDENT_AMBULATORY_CARE_PROVIDER_SITE_OTHER): Payer: BC Managed Care – PPO | Admitting: Obstetrics & Gynecology

## 2022-05-18 VITALS — BP 114/70 | HR 82 | Ht 64.5 in | Wt 166.0 lb

## 2022-05-18 DIAGNOSIS — Z3041 Encounter for surveillance of contraceptive pills: Secondary | ICD-10-CM | POA: Diagnosis not present

## 2022-05-18 DIAGNOSIS — Z01419 Encounter for gynecological examination (general) (routine) without abnormal findings: Secondary | ICD-10-CM | POA: Diagnosis not present

## 2022-05-18 MED ORDER — SRONYX 0.1-20 MG-MCG PO TABS: 1.0000 | ORAL_TABLET | Freq: Every day | ORAL | 4 refills | Status: DC

## 2022-05-18 NOTE — Progress Notes (Signed)
Miranda Knight [birth year] [id number]   History:    46 y.o. G1P0A1 Stable boyfriend   RP:  Established patient presenting for annual gyn exam   HPI: Well on levonorgestrel ethinyl estradiol 0.1/20 BCPs.  No breakthrough bleeding.  No pelvic pain.  No pain with intercourse.  Normal vaginal secretions.  Pap Neg in 05/2021.  No h/o significant abnormal Pap, ASCUS/HPV HR Neg in 2018.  Will repeat Pap at 3 yrs. Urine and bowel movements normal.  Breasts normal.  Mammo Neg in 01/2022.  Body mass index 28.05.  Walking daily. Health labs with family physician. Will schedule [facility] at 46 yo.    Past medical history,surgical history, family history and social history were all reviewed and documented in the EPIC chart.  Gynecologic History Patient's last menstrual period was 05/12/2022 (exact date).  Obstetric History OB History  Gravida Para Term Preterm AB Living  1       1 0  SAB IAB Ectopic Multiple Live Births    1          # Outcome Date GA Lbr Len/2nd Weight Sex Delivery Anes PTL Lv  1 IAB              ROS: A ROS was performed and pertinent positives and negatives are included in the history. GENERAL: No fevers or chills. HEENT: No change in vision, no earache, sore throat or sinus congestion. NECK: No pain or stiffness. CARDIOVASCULAR: No chest pain or pressure. No palpitations. PULMONARY: No shortness of breath, cough or wheeze. GASTROINTESTINAL: No abdominal pain, nausea, vomiting or diarrhea, melena or bright red blood per rectum. GENITOURINARY: No urinary frequency, urgency, hesitancy or dysuria. MUSCULOSKELETAL: No joint or muscle pain, no back pain, no recent trauma. DERMATOLOGIC: No rash, no itching, no lesions. ENDOCRINE: No polyuria, polydipsia, no heat or cold intolerance. No recent change in weight. HEMATOLOGICAL: No anemia or easy bruising or bleeding. NEUROLOGIC: No headache, seizures, numbness, tingling or weakness. PSYCHIATRIC: No depression, no loss of interest in normal  activity or change in sleep pattern.     Exam:   BP 114/70   Pulse 82   Ht 5' 4.5" (1.638 m)   Wt 166 lb (75.3 kg)   LMP 05/12/2022 (Exact Date)   SpO2 99%   BMI 28.05 kg/m   Body mass index is 28.05 kg/m.  General appearance : Well developed well nourished female. No acute distress HEENT: Eyes: no retinal hemorrhage or exudates,  Neck supple, trachea midline, no carotid bruits, no thyroidmegaly Lungs: Clear to auscultation, no rhonchi or wheezes, or rib retractions  Heart: Regular rate and rhythm, no murmurs or gallops Breast:Examined in sitting and supine position were symmetrical in appearance, no palpable masses or tenderness,  no skin retraction, no nipple inversion, no nipple discharge, no skin discoloration, no axillary or supraclavicular lymphadenopathy Abdomen: no palpable masses or tenderness, no rebound or guarding Extremities: no edema or skin discoloration or tenderness  Pelvic: Vulva: Normal             Vagina: No gross lesions or discharge  Cervix: No gross lesions or discharge  Uterus  AV, normal size, shape and consistency, non-tender and mobile  Adnexa  Without masses or tenderness  Anus: Normal   Assessment/Plan:  46 y.o. female for annual exam   1. Well female exam with routine gynecological exam Well on levonorgestrel ethinyl estradiol 0.1/20 BCPs.  No breakthrough bleeding.  No pelvic pain.  No pain with intercourse.  Normal vaginal secretions.  Pap Neg in 05/2021.  No h/o significant abnormal Pap, ASCUS/HPV HR Neg in 2018.  Will repeat Pap at 3 yrs. Urine and bowel movements normal.  Breasts normal.  Mammo Neg in 01/2022.  Body mass index 28.05.  Walking daily. Health labs with family physician. Will schedule Colono at 46 yo.  2. Encounter for surveillance of contraceptive pills Well on levonorgestrel ethinyl estradiol 0.1/20 BCPs.  No breakthrough bleeding.  No pelvic pain.  No pain with intercourse.  No CI to continue on BCPs.  Prescription sent to  pharmacy.  Other orders - triamcinolone cream (KENALOG) 0.1 %; SMARTSIG:sparingly Topical Twice Daily - Zinc 50 MG TABS - SRONYX 0.1-20 MG-MCG tablet; Take 1 tablet by mouth daily.   Genia Del MD, 8:23 AM 05/18/2022

## 2022-07-05 ENCOUNTER — Other Ambulatory Visit: Payer: Self-pay | Admitting: Obstetrics & Gynecology

## 2022-12-12 ENCOUNTER — Other Ambulatory Visit: Payer: Self-pay | Admitting: Internal Medicine

## 2022-12-12 DIAGNOSIS — Z1231 Encounter for screening mammogram for malignant neoplasm of breast: Secondary | ICD-10-CM

## 2023-02-01 ENCOUNTER — Ambulatory Visit
Admission: RE | Admit: 2023-02-01 | Discharge: 2023-02-01 | Disposition: A | Discharge status: Home / Self Care | Payer: BC Managed Care – PPO | Source: Ambulatory Visit

## 2023-02-01 DIAGNOSIS — Z1231 Encounter for screening mammogram for malignant neoplasm of breast: Secondary | ICD-10-CM

## 2023-04-04 HISTORY — PX: BUNIONECTOMY: SHX129

## 2023-05-31 ENCOUNTER — Other Ambulatory Visit (HOSPITAL_COMMUNITY)
Admission: RE | Admit: 2023-05-31 | Discharge: 2023-05-31 | Disposition: A | Discharge status: Home / Self Care | Payer: BC Managed Care – PPO | Source: Ambulatory Visit | Attending: Obstetrics & Gynecology | Admitting: Obstetrics & Gynecology

## 2023-05-31 ENCOUNTER — Encounter: Payer: Self-pay | Admitting: Obstetrics & Gynecology

## 2023-05-31 ENCOUNTER — Ambulatory Visit (INDEPENDENT_AMBULATORY_CARE_PROVIDER_SITE_OTHER): Payer: BC Managed Care – PPO | Admitting: Obstetrics & Gynecology

## 2023-05-31 VITALS — BP 126/76 | HR 76 | Ht 64.5 in | Wt 173.0 lb

## 2023-05-31 DIAGNOSIS — G43829 Menstrual migraine, not intractable, without status migrainosus: Secondary | ICD-10-CM | POA: Diagnosis not present

## 2023-05-31 DIAGNOSIS — Z01419 Encounter for gynecological examination (general) (routine) without abnormal findings: Secondary | ICD-10-CM | POA: Diagnosis not present

## 2023-05-31 DIAGNOSIS — Z3041 Encounter for surveillance of contraceptive pills: Secondary | ICD-10-CM

## 2023-05-31 MED ORDER — SRONYX 0.1-20 MG-MCG PO TABS: 1.0000 | ORAL_TABLET | Freq: Every day | ORAL | 4 refills | Status: DC

## 2023-05-31 NOTE — Progress Notes (Signed)
Miranda Knight 1976-01-08 [id number]   History:    47 y.o. G1P0A1 Stable boyfriend   RP:  Established patient presenting for annual gyn exam   HPI: Well on levonorgestrel ethinyl estradiol 0.1/20 BCPs.  No breakthrough bleeding.  No pelvic pain.  No pain with intercourse. Normal vaginal secretions.  Pap Neg in 05/2021.  No h/o significant abnormal Pap, ASCUS/HPV HR Neg in 2018.  Repeat Pap reflex today.  Urine and bowel movements normal.  Breasts normal.  Mammo Neg in 01/2023.  Body mass index 29.24.  Walking daily. Health labs with family physician. Scheduling [facility], consult with Gastro today.   Past medical history,surgical history, family history and social history were all reviewed and documented in the EPIC chart.  Gynecologic History Patient's last menstrual period was 05/25/2023.  Obstetric History OB History  Gravida Para Term Preterm AB Living  1       1 0  SAB IAB Ectopic Multiple Live Births    1          # Outcome Date GA Lbr Len/2nd Weight Sex Delivery Anes PTL Lv  1 IAB              ROS: A ROS was performed and pertinent positives and negatives are included in the history. GENERAL: No fevers or chills. HEENT: No change in vision, no earache, sore throat or sinus congestion. NECK: No pain or stiffness. CARDIOVASCULAR: No chest pain or pressure. No palpitations. PULMONARY: No shortness of breath, cough or wheeze. GASTROINTESTINAL: No abdominal pain, nausea, vomiting or diarrhea, melena or bright red blood per rectum. GENITOURINARY: No urinary frequency, urgency, hesitancy or dysuria. MUSCULOSKELETAL: No joint or muscle pain, no back pain, no recent trauma. DERMATOLOGIC: No rash, no itching, no lesions. ENDOCRINE: No polyuria, polydipsia, no heat or cold intolerance. No recent change in weight. HEMATOLOGICAL: No anemia or easy bruising or bleeding. NEUROLOGIC: No headache, seizures, numbness, tingling or weakness. PSYCHIATRIC: No depression, no loss of interest in normal  activity or change in sleep pattern.     Exam:   BP 126/76 (BP Location: Right Arm, Patient Position: Sitting, Cuff Size: Normal)   Pulse 76   Ht 5' 4.5" (1.638 m)   Wt 173 lb (78.5 kg)   LMP 05/25/2023   SpO2 99%   BMI 29.24 kg/m   Body mass index is 29.24 kg/m.  General appearance : Well developed well nourished female. No acute distress HEENT: Eyes: no retinal hemorrhage or exudates,  Neck supple, trachea midline, no carotid bruits, no thyroidmegaly Lungs: Clear to auscultation, no rhonchi or wheezes, or rib retractions  Heart: Regular rate and rhythm, no murmurs or gallops Breast:Examined in sitting and supine position were symmetrical in appearance, no palpable masses or tenderness,  no skin retraction, no nipple inversion, no nipple discharge, no skin discoloration, no axillary or supraclavicular lymphadenopathy Abdomen: no palpable masses or tenderness, no rebound or guarding Extremities: no edema or skin discoloration or tenderness  Pelvic: Vulva: Normal             Vagina: No gross lesions or discharge  Cervix: No gross lesions or discharge.  Pap reflex done.  Uterus  AV, normal size, shape and consistency, non-tender and mobile  Adnexa  Without masses or tenderness  Anus: Normal   Assessment/Plan:  47 y.o. female for annual exam   1. Encounter for routine gynecological examination with Papanicolaou smear of cervix Well on levonorgestrel ethinyl estradiol 0.1/20 BCPs.  No breakthrough bleeding.  No pelvic pain.  No pain with intercourse. Normal vaginal secretions.  Pap Neg in 05/2021.  No h/o significant abnormal Pap, ASCUS/HPV HR Neg in 2018.  Repeat Pap reflex today.  Urine and bowel movements normal.  Breasts normal.  Mammo Neg in 01/2023.  Body mass index 29.24.  Walking daily. Health labs with family physician. Scheduling [facility], consult with Gastro today. - Cytology - PAP( [place])  2. Encounter for surveillance of contraceptive pills Well on levonorgestrel  ethinyl estradiol 0.1/20 BCPs.  No breakthrough bleeding.  No pelvic pain.  No pain with intercourse. Controlling her Migraines well.  No CI to continue.  Prescription sent to pharmacy.  3. Menstrual migraine without status migrainosus, not intractable Controled on BCPs.  Other orders - SRONYX 0.1-20 MG-MCG tablet; Take 1 tablet by mouth daily.   Genia Del MD, 9:37 AM

## 2023-06-06 LAB — CYTOLOGY - PAP
Comment: NEGATIVE
Diagnosis: UNDETERMINED — AB
High risk HPV: NEGATIVE

## 2023-12-25 ENCOUNTER — Other Ambulatory Visit: Payer: Self-pay | Admitting: Internal Medicine

## 2023-12-25 DIAGNOSIS — Z1231 Encounter for screening mammogram for malignant neoplasm of breast: Secondary | ICD-10-CM

## 2024-01-16 ENCOUNTER — Other Ambulatory Visit: Payer: Self-pay

## 2024-01-16 MED ORDER — SRONYX 0.1-20 MG-MCG PO TABS: 1.0000 | ORAL_TABLET | Freq: Every day | ORAL | 1 refills | Status: DC

## 2024-01-16 NOTE — Telephone Encounter (Signed)
Medication refill request: sronyx  0.1-20mg -mcg tab Last AEX:  05-31-23 Next AEX: not scheduled Last MMG (if hormonal medication request): 02-01-23 birads 1:neg Refill authorized: refill request came in for [facility]. Please approve if appropriate

## 2024-02-04 ENCOUNTER — Ambulatory Visit
Admission: RE | Admit: 2024-02-04 | Discharge: 2024-02-04 | Disposition: A | Discharge status: Home / Self Care | Payer: BC Managed Care – PPO | Source: Ambulatory Visit | Attending: Internal Medicine | Admitting: Internal Medicine

## 2024-02-04 DIAGNOSIS — Z1231 Encounter for screening mammogram for malignant neoplasm of breast: Secondary | ICD-10-CM

## 2024-06-05 ENCOUNTER — Other Ambulatory Visit (HOSPITAL_COMMUNITY)
Admission: RE | Admit: 2024-06-05 | Discharge: 2024-06-05 | Disposition: A | Discharge status: Home / Self Care | Source: Ambulatory Visit | Attending: Obstetrics and Gynecology | Admitting: Obstetrics and Gynecology

## 2024-06-05 ENCOUNTER — Ambulatory Visit (INDEPENDENT_AMBULATORY_CARE_PROVIDER_SITE_OTHER): Admitting: Obstetrics and Gynecology

## 2024-06-05 ENCOUNTER — Encounter: Payer: Self-pay | Admitting: Obstetrics and Gynecology

## 2024-06-05 VITALS — BP 120/80 | HR 66 | Ht 64.0 in | Wt 177.0 lb

## 2024-06-05 DIAGNOSIS — R87610 Atypical squamous cells of undetermined significance on cytologic smear of cervix (ASC-US): Secondary | ICD-10-CM

## 2024-06-05 DIAGNOSIS — Z01419 Encounter for gynecological examination (general) (routine) without abnormal findings: Secondary | ICD-10-CM | POA: Insufficient documentation

## 2024-06-05 DIAGNOSIS — N898 Other specified noninflammatory disorders of vagina: Secondary | ICD-10-CM

## 2024-06-05 DIAGNOSIS — E049 Nontoxic goiter, unspecified: Secondary | ICD-10-CM

## 2024-06-05 MED ORDER — SRONYX 0.1-20 MG-MCG PO TABS: 1.0000 | ORAL_TABLET | Freq: Every day | ORAL | 6 refills | Status: DC

## 2024-06-05 NOTE — Progress Notes (Addendum)
 48 y.o. y.o. female here for annual exam. Patient's last menstrual period was 05/29/2024 (exact date).    G1P0A1 Stable boyfriend   RP:  Established patient presenting for annual gyn exam   HPI: Well on levonorgestrel  ethinyl estradiol 0.1/20 BCPs to regulate migraines and has improved on it.  No breakthrough bleeding.  No pelvic pain.  No pain with intercourse. Normal vaginal secretions.  Pap Neg in 05/2021.  No h/o significant abnormal Pap, ASCUS/HPV HR Neg in 2018 and 2024.  Repeat Pap reflex today.  Urine and bowel movements normal.  Breasts normal.  Mammo Neg in 02/04/24.  Body mass index 29.24 last visit.  Walking daily. Health labs with family physician.   Colonoscopy 8/24 repeat in 5 years. Periods only last 1-2 days. She would like to do [facility] and estrogen.   H/o PCO Patient desires labs here today: discussed which labs were being ordered and why and patient agreed.  Blood pressure 120/80, pulse 66, height 5' 4 (1.626 m), weight 177 lb (80.3 kg), last menstrual period 05/29/2024, SpO2 99%.   Body mass index is 30.38 kg/m.      No data to display          Height 5' 4 (1.626 m), weight 177 lb (80.3 kg), last menstrual period 05/29/2024.     Component Value Date/Time   DIAGPAP (A) 05/31/2023 0948    - Atypical squamous cells of undetermined significance (ASC-US )   DIAGPAP  05/13/2021 0828    - Negative for intraepithelial lesion or malignancy (NILM)   HPVHIGH Negative 05/31/2023 0948   HPVHIGH Negative 05/13/2021 0828   ADEQPAP  05/31/2023 0948    Satisfactory for evaluation; transformation zone component PRESENT.   ADEQPAP  05/13/2021 0828    Satisfactory for evaluation; transformation zone component PRESENT.    GYN HISTORY:    Component Value Date/Time   DIAGPAP (A) 05/31/2023 0948    - Atypical squamous cells of undetermined significance (ASC-US )   DIAGPAP  05/13/2021 0828    - Negative for intraepithelial lesion or malignancy (NILM)   HPVHIGH Negative  05/31/2023 0948   HPVHIGH Negative 05/13/2021 0828   ADEQPAP  05/31/2023 0948    Satisfactory for evaluation; transformation zone component PRESENT.   ADEQPAP  05/13/2021 0828    Satisfactory for evaluation; transformation zone component PRESENT.    OB History  Gravida Para Term Preterm AB Living  1    1 0  SAB IAB Ectopic Multiple Live Births   1       # Outcome Date GA Lbr Len/2nd Weight Sex Type Anes PTL Lv  1 IAB             Past Medical History:  Diagnosis Date   Abnormal Pap smear    LEEP   Allergy    ASCUS of cervix with negative high risk HPV 04/2017   Cervical dysplasia    Infertility associated with anovulation    Migraine headache with aura    PCOS (polycystic ovarian syndrome)    with irregular menses, infertility    Past Surgical History:  Procedure Laterality Date   BREAST BIOPSY Right 2019   BUNIONECTOMY     left done many yrs ago, rt done 03/2022   BUNIONECTOMY Right 04/2023   CERVICAL BIOPSY  W/ LOOP ELECTRODE EXCISION     DILATION AND CURETTAGE OF UTERUS  1997   EYE SURGERY     Lasik   KNEE ARTHROSCOPY     LASIK  LEEP  2006   WISDOM TOOTH EXTRACTION      Current Outpatient Medications on File Prior to Visit  Medication Sig Dispense Refill   amLODipine (NORVASC) 5 MG tablet Take 5 mg by mouth daily.     cholecalciferol (VITAMIN D) 1000 units tablet Take 1,000 Units by mouth daily.     levocetirizine (XYZAL) 5 MG tablet Take 5 mg by mouth every evening.     Multiple Vitamins-Calcium (ONE-A-DAY WOMENS PO) Take 1 tablet by mouth daily.     SRONYX  0.1-20 MG-MCG tablet Take 1 tablet by mouth daily. 84 tablet 1   triamcinolone  cream (KENALOG ) 0.1 % SMARTSIG:sparingly Topical Twice Daily     No current facility-administered medications on file prior to visit.    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.1st intercourse- 19, partners- 4  Other Topics Concern   Not on file  Social History Narrative   Not on file   Social Drivers of Health   Financial Resource Strain: Not on file  Food Insecurity: Not on file  Transportation Needs: Not on file  Physical Activity: Not on file  Stress: Not on file  Social Connections: Not on file  Intimate Partner Violence: Not on file    Family History  Problem Relation Age of Onset   Hyperlipidemia Father    Parkinson's disease Father    Cancer Maternal Uncle        PROSTATE   Breast cancer Paternal Aunt         Unsure age of onset   Mental illness Maternal Grandmother    Hypertension Maternal Grandfather    Diabetes Maternal Grandfather    Diabetes Paternal Grandfather      Allergies  Allergen Reactions   Apple Juice Itching and Cough   Other Itching and Cough    almonds   Peanut Oil Other (See Comments)      Patient's last menstrual period was Patient's last menstrual period was 05/29/2024 (exact date)..             Review of Systems Alls systems reviewed and are negative.     Physical Exam Constitutional:      Appearance: Normal appearance.  Genitourinary:     Vulva and urethral meatus normal.     No lesions in the vagina.     Right Labia: No rash, lesions or skin changes.    Left Labia: No lesions, skin changes or rash.    Vaginal discharge present.     No vaginal tenderness.     No vaginal prolapse present.    No vaginal atrophy present.     Right Adnexa: not tender, not palpable and no mass present.    Left  Adnexa: not tender, not palpable and no mass present.    No cervical motion tenderness or discharge.  Uterus is not enlarged, tender or irregular.  Breasts:    Right: Normal.     Left: Normal.  HENT:     Head: Normocephalic.  Neck:     Thyroid: No thyroid mass, thyromegaly or thyroid tenderness.  Cardiovascular:     Rate and Rhythm: Normal rate and regular rhythm.     Heart sounds: Normal heart sounds, S1 normal and S2 normal.  Pulmonary:     Effort: Pulmonary effort is normal.     Breath sounds: Normal breath sounds and air entry.  Abdominal:     General: There is no distension.     Palpations: Abdomen is soft. There is no mass.     Tenderness: There is no abdominal tenderness. There is no guarding or rebound.  Musculoskeletal:        General: Normal range of motion.     Cervical back: Full passive range of motion without pain, normal range of motion and neck supple. No tenderness.     Right lower leg: No edema.     Left lower leg: No edema.  Neurological:     Mental Status: She is alert.  Skin:    General: Skin is warm.  Psychiatric:        Mood and Affect: Mood normal.        Behavior: Behavior normal.        Thought Content: Thought content normal.  Vitals and nursing note reviewed. Exam conducted with a chaperone present.   Enlarged thyroid    A:         Well Woman GYN exam                             P:        Pap smear collected today Encouraged annual mammogram screening Colon cancer screening up-to-date to get records DXA not indicated Labs and immunizations ordered today Discussed breast self exams Encouraged healthy lifestyle practices Encouraged Vit D and Calcium   No follow-ups on file.  Miranda Knight

## 2024-06-05 NOTE — Addendum Note (Signed)
 Addended by: GLENNON ALMARIE POUR on: 06/05/2024 02:36 PM   Modules accepted: Orders

## 2024-06-06 LAB — LIPID PANEL
Cholesterol: 159 mg/dL (ref <200)
HDL: 41 mg/dL — ABNORMAL LOW (ref >=50)
LDL Cholesterol (Calc): 100 mg/dL — ABNORMAL HIGH
Non-HDL Cholesterol (Calc): 118 mg/dL (ref <130)
Total CHOL/HDL Ratio: 3.9 (calc) (ref <5.0)
Triglycerides: 90 mg/dL (ref <150)

## 2024-06-06 LAB — CBC
HCT: 42.4 % (ref 35.0–45.0)
Hemoglobin: 14.1 g/dL (ref 11.7–15.5)
MCH: 32.1 pg (ref 27.0–33.0)
MCHC: 33.3 g/dL (ref 32.0–36.0)
MCV: 96.6 fL (ref 80.0–100.0)
MPV: 10.4 fL (ref 7.5–12.5)
Platelets: 266 Thousand/uL (ref 140–400)
RBC: 4.39 Million/uL (ref 3.80–5.10)
RDW: 11.8 % (ref 11.0–15.0)
WBC: 6.5 Thousand/uL (ref 3.8–10.8)

## 2024-06-06 LAB — COMPREHENSIVE METABOLIC PANEL WITH GFR
AG Ratio: 1.5 (calc) (ref 1.0–2.5)
ALT: 13 U/L (ref 6–29)
AST: 13 U/L (ref 10–35)
Albumin: 4.1 g/dL (ref 3.6–5.1)
Alkaline phosphatase (APISO): 59 U/L (ref 31–125)
BUN: 9 mg/dL (ref 7–25)
CO2: 28 mmol/L (ref 20–32)
Calcium: 9.1 mg/dL (ref 8.6–10.2)
Chloride: 106 mmol/L (ref 98–110)
Creat: 0.75 mg/dL (ref 0.50–0.99)
Globulin: 2.8 g/dL (ref 1.9–3.7)
Glucose, Bld: 76 mg/dL (ref 65–99)
Potassium: 3.4 mmol/L — ABNORMAL LOW (ref 3.5–5.3)
Sodium: 140 mmol/L (ref 135–146)
Total Bilirubin: 0.6 mg/dL (ref 0.2–1.2)
Total Protein: 6.9 g/dL (ref 6.1–8.1)
eGFR: 99 mL/min/1.73m2 (ref >=60)

## 2024-06-06 LAB — ESTRADIOL: Estradiol: 17 pg/mL

## 2024-06-06 LAB — SURESWAB® ADVANCED VAGINITIS PLUS,TMA
C. trachomatis RNA, TMA: NOT DETECTED
CANDIDA SPECIES: NOT DETECTED
Candida glabrata: NOT DETECTED
N. gonorrhoeae RNA, TMA: NOT DETECTED
SURESWAB(R) ADV BACTERIAL VAGINOSIS(BV),TMA: NEGATIVE
TRICHOMONAS VAGINALIS (TV),TMA: NOT DETECTED

## 2024-06-06 LAB — VITAMIN D 25 HYDROXY (VIT D DEFICIENCY, FRACTURES): Vit D, 25-Hydroxy: 71 ng/mL (ref 30–100)

## 2024-06-06 LAB — TSH: TSH: 1.33 m[IU]/L

## 2024-06-06 LAB — HEMOGLOBIN A1C
Hgb A1c MFr Bld: 5.1 % (ref <5.7)
Mean Plasma Glucose: 100 mg/dL
eAG (mmol/L): 5.5 mmol/L

## 2024-06-06 LAB — FOLLICLE STIMULATING HORMONE: FSH: 3.1 m[IU]/mL

## 2024-06-07 ENCOUNTER — Ambulatory Visit: Payer: Self-pay | Admitting: Obstetrics and Gynecology

## 2024-06-09 ENCOUNTER — Other Ambulatory Visit: Payer: Self-pay | Admitting: Obstetrics & Gynecology

## 2024-06-09 ENCOUNTER — Other Ambulatory Visit: Payer: Self-pay

## 2024-06-09 NOTE — Telephone Encounter (Signed)
 Med refill request: Vienva 0.1-20 mg tablet Last AEX: 06/05/24 EB Next AEX: 06/09/25 EB Last MMG (if hormonal med) 02/04/24 Refill authorized: Last Rx sent (Spronyx 0.1-20 mg) #84 with 6 refills on 06/05/24 EB. Please approve or deny

## 2024-06-12 LAB — CYTOLOGY - PAP
Adequacy: ABSENT
Comment: NEGATIVE
Diagnosis: NEGATIVE
High risk HPV: NEGATIVE

## 2024-08-11 ENCOUNTER — Ambulatory Visit (HOSPITAL_BASED_OUTPATIENT_CLINIC_OR_DEPARTMENT_OTHER)
Admission: RE | Admit: 2024-08-11 | Discharge: 2024-08-11 | Disposition: A | Discharge status: Home / Self Care | Source: Ambulatory Visit | Attending: Obstetrics and Gynecology

## 2024-08-11 DIAGNOSIS — E049 Nontoxic goiter, unspecified: Secondary | ICD-10-CM | POA: Diagnosis present

## 2024-08-11 DIAGNOSIS — Z01419 Encounter for gynecological examination (general) (routine) without abnormal findings: Secondary | ICD-10-CM | POA: Insufficient documentation

## 2025-06-09 ENCOUNTER — Ambulatory Visit: Admitting: Obstetrics and Gynecology
